# Patient Record
Sex: Female | Born: 1980 | Hispanic: No | Marital: Single | Smoking: Former smoker
Health system: Southern US, Community
[De-identification: ages and names within clinical notes are randomized; demographics above are authoritative.]

## PROBLEM LIST (undated history)

## (undated) DIAGNOSIS — F419 Anxiety disorder, unspecified: Secondary | ICD-10-CM

## (undated) DIAGNOSIS — N83209 Unspecified ovarian cyst, unspecified side: Secondary | ICD-10-CM

## (undated) DIAGNOSIS — F32A Depression, unspecified: Secondary | ICD-10-CM

## (undated) DIAGNOSIS — G43909 Migraine, unspecified, not intractable, without status migrainosus: Secondary | ICD-10-CM

## (undated) DIAGNOSIS — J45909 Unspecified asthma, uncomplicated: Secondary | ICD-10-CM

## (undated) DIAGNOSIS — F909 Attention-deficit hyperactivity disorder, unspecified type: Secondary | ICD-10-CM

## (undated) DIAGNOSIS — D649 Anemia, unspecified: Secondary | ICD-10-CM

## (undated) DIAGNOSIS — A749 Chlamydial infection, unspecified: Secondary | ICD-10-CM

## (undated) DIAGNOSIS — F329 Major depressive disorder, single episode, unspecified: Secondary | ICD-10-CM

## (undated) HISTORY — DX: Depression, unspecified: F32.A

## (undated) HISTORY — DX: Unspecified ovarian cyst, unspecified side: N83.209

## (undated) HISTORY — DX: Attention-deficit hyperactivity disorder, unspecified type: F90.9

## (undated) HISTORY — DX: Unspecified asthma, uncomplicated: J45.909

## (undated) HISTORY — PX: CHOLECYSTECTOMY: SHX55

## (undated) HISTORY — DX: Major depressive disorder, single episode, unspecified: F32.9

## (undated) HISTORY — PX: DILATION AND CURETTAGE OF UTERUS: SHX78

## (undated) HISTORY — DX: Anemia, unspecified: D64.9

## (undated) HISTORY — DX: Chlamydial infection, unspecified: A74.9

## (undated) HISTORY — DX: Migraine, unspecified, not intractable, without status migrainosus: G43.909

## (undated) HISTORY — DX: Anxiety disorder, unspecified: F41.9

---
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
## 2000-11-17 ENCOUNTER — Ambulatory Visit (HOSPITAL_COMMUNITY): Admission: RE | Admit: 2000-11-17 | Discharge: 2000-11-17 | Payer: Self-pay | Admitting: *Deleted

## 2001-01-08 ENCOUNTER — Inpatient Hospital Stay (HOSPITAL_COMMUNITY): Admission: AD | Admit: 2001-01-08 | Discharge: 2001-01-08 | Payer: Self-pay | Admitting: Obstetrics & Gynecology

## 2001-03-19 ENCOUNTER — Inpatient Hospital Stay (HOSPITAL_COMMUNITY): Admission: AD | Admit: 2001-03-19 | Discharge: 2001-03-21 | Payer: Self-pay | Admitting: *Deleted

## 2001-11-12 ENCOUNTER — Emergency Department (HOSPITAL_COMMUNITY): Admission: EM | Admit: 2001-11-12 | Discharge: 2001-11-12 | Payer: Self-pay | Admitting: Emergency Medicine

## 2002-01-28 ENCOUNTER — Emergency Department (HOSPITAL_COMMUNITY): Admission: EM | Admit: 2002-01-28 | Discharge: 2002-01-29 | Payer: Self-pay | Admitting: Emergency Medicine

## 2002-01-29 ENCOUNTER — Encounter: Payer: Self-pay | Admitting: Emergency Medicine

## 2002-02-19 ENCOUNTER — Encounter (INDEPENDENT_AMBULATORY_CARE_PROVIDER_SITE_OTHER): Payer: Self-pay | Admitting: Specialist

## 2002-02-19 ENCOUNTER — Observation Stay (HOSPITAL_COMMUNITY): Admission: RE | Admit: 2002-02-19 | Discharge: 2002-02-20 | Payer: Self-pay

## 2002-04-23 ENCOUNTER — Ambulatory Visit (HOSPITAL_COMMUNITY): Admission: RE | Admit: 2002-04-23 | Discharge: 2002-04-23 | Payer: Self-pay | Admitting: Pediatrics

## 2002-04-23 ENCOUNTER — Encounter: Payer: Self-pay | Admitting: Cardiology

## 2002-08-26 ENCOUNTER — Emergency Department (HOSPITAL_COMMUNITY): Admission: EM | Admit: 2002-08-26 | Discharge: 2002-08-26 | Payer: Self-pay

## 2002-09-19 ENCOUNTER — Encounter (INDEPENDENT_AMBULATORY_CARE_PROVIDER_SITE_OTHER): Payer: Self-pay

## 2002-09-19 ENCOUNTER — Observation Stay (HOSPITAL_COMMUNITY): Admission: AD | Admit: 2002-09-19 | Discharge: 2002-09-20 | Payer: Self-pay | Admitting: Family Medicine

## 2003-01-17 ENCOUNTER — Emergency Department (HOSPITAL_COMMUNITY): Admission: EM | Admit: 2003-01-17 | Discharge: 2003-01-17 | Payer: Self-pay | Admitting: Emergency Medicine

## 2003-03-22 ENCOUNTER — Emergency Department (HOSPITAL_COMMUNITY): Admission: EM | Admit: 2003-03-22 | Discharge: 2003-03-22 | Payer: Self-pay | Admitting: Emergency Medicine

## 2003-07-20 ENCOUNTER — Emergency Department (HOSPITAL_COMMUNITY): Admission: EM | Admit: 2003-07-20 | Discharge: 2003-07-20 | Payer: Self-pay | Admitting: Emergency Medicine

## 2004-01-22 ENCOUNTER — Emergency Department (HOSPITAL_COMMUNITY): Admission: AD | Admit: 2004-01-22 | Discharge: 2004-01-22 | Payer: Self-pay | Admitting: Family Medicine

## 2004-02-24 ENCOUNTER — Ambulatory Visit (HOSPITAL_COMMUNITY): Admission: RE | Admit: 2004-02-24 | Discharge: 2004-02-24 | Payer: Self-pay | Admitting: *Deleted

## 2004-03-08 ENCOUNTER — Encounter: Admission: RE | Admit: 2004-03-08 | Discharge: 2004-03-08 | Payer: Self-pay | Admitting: Obstetrics & Gynecology

## 2004-03-22 ENCOUNTER — Encounter: Admission: RE | Admit: 2004-03-22 | Discharge: 2004-03-22 | Payer: Self-pay | Admitting: *Deleted

## 2004-03-28 ENCOUNTER — Ambulatory Visit (HOSPITAL_COMMUNITY): Admission: RE | Admit: 2004-03-28 | Discharge: 2004-03-28 | Payer: Self-pay | Admitting: Family Medicine

## 2004-04-01 ENCOUNTER — Ambulatory Visit: Payer: Self-pay | Admitting: Family Medicine

## 2004-04-01 ENCOUNTER — Inpatient Hospital Stay (HOSPITAL_COMMUNITY): Admission: AD | Admit: 2004-04-01 | Discharge: 2004-04-03 | Payer: Self-pay | Admitting: Otolaryngology

## 2004-04-05 ENCOUNTER — Encounter: Admission: RE | Admit: 2004-04-05 | Discharge: 2004-04-05 | Payer: Self-pay | Admitting: *Deleted

## 2004-04-26 ENCOUNTER — Ambulatory Visit: Payer: Self-pay | Admitting: Family Medicine

## 2004-05-03 ENCOUNTER — Ambulatory Visit: Payer: Self-pay | Admitting: Family Medicine

## 2004-05-03 ENCOUNTER — Ambulatory Visit (HOSPITAL_COMMUNITY): Admission: RE | Admit: 2004-05-03 | Discharge: 2004-05-03 | Payer: Self-pay | Admitting: *Deleted

## 2004-05-09 ENCOUNTER — Inpatient Hospital Stay (HOSPITAL_COMMUNITY): Admission: AD | Admit: 2004-05-09 | Discharge: 2004-05-09 | Payer: Self-pay | Admitting: Obstetrics and Gynecology

## 2004-08-13 ENCOUNTER — Inpatient Hospital Stay (HOSPITAL_COMMUNITY): Admission: AD | Admit: 2004-08-13 | Discharge: 2004-08-13 | Payer: Self-pay | Admitting: Obstetrics and Gynecology

## 2004-09-19 ENCOUNTER — Ambulatory Visit: Payer: Self-pay | Admitting: *Deleted

## 2004-09-20 ENCOUNTER — Ambulatory Visit (HOSPITAL_COMMUNITY): Admission: RE | Admit: 2004-09-20 | Discharge: 2004-09-20 | Payer: Self-pay | Admitting: Obstetrics & Gynecology

## 2004-09-20 ENCOUNTER — Inpatient Hospital Stay (HOSPITAL_COMMUNITY): Admission: AD | Admit: 2004-09-20 | Discharge: 2004-09-20 | Payer: Self-pay | Admitting: Obstetrics & Gynecology

## 2004-09-22 ENCOUNTER — Inpatient Hospital Stay (HOSPITAL_COMMUNITY): Admission: AD | Admit: 2004-09-22 | Discharge: 2004-09-22 | Payer: Self-pay | Admitting: Family Medicine

## 2004-09-22 ENCOUNTER — Ambulatory Visit: Payer: Self-pay | Admitting: Family Medicine

## 2004-09-25 ENCOUNTER — Inpatient Hospital Stay (HOSPITAL_COMMUNITY): Admission: AD | Admit: 2004-09-25 | Discharge: 2004-09-27 | Payer: Self-pay | Admitting: *Deleted

## 2004-09-25 ENCOUNTER — Ambulatory Visit: Payer: Self-pay | Admitting: *Deleted

## 2005-10-06 ENCOUNTER — Emergency Department (HOSPITAL_COMMUNITY): Admission: EM | Admit: 2005-10-06 | Discharge: 2005-10-06 | Payer: Self-pay | Admitting: Family Medicine

## 2005-11-29 ENCOUNTER — Emergency Department (HOSPITAL_COMMUNITY): Admission: EM | Admit: 2005-11-29 | Discharge: 2005-11-30 | Payer: Self-pay | Admitting: Emergency Medicine

## 2005-12-12 ENCOUNTER — Emergency Department (HOSPITAL_COMMUNITY): Admission: EM | Admit: 2005-12-12 | Discharge: 2005-12-12 | Payer: Self-pay | Admitting: Emergency Medicine

## 2007-08-06 ENCOUNTER — Inpatient Hospital Stay (HOSPITAL_COMMUNITY): Admission: AD | Admit: 2007-08-06 | Discharge: 2007-08-06 | Payer: Self-pay | Admitting: Obstetrics & Gynecology

## 2007-08-21 ENCOUNTER — Ambulatory Visit: Payer: Self-pay | Admitting: Gynecology

## 2007-08-21 ENCOUNTER — Ambulatory Visit (HOSPITAL_COMMUNITY): Admission: AD | Admit: 2007-08-21 | Discharge: 2007-08-21 | Payer: Self-pay | Admitting: Obstetrics and Gynecology

## 2007-08-21 ENCOUNTER — Encounter (INDEPENDENT_AMBULATORY_CARE_PROVIDER_SITE_OTHER): Payer: Self-pay | Admitting: Gynecology

## 2007-11-16 ENCOUNTER — Emergency Department (HOSPITAL_COMMUNITY): Admission: EM | Admit: 2007-11-16 | Discharge: 2007-11-16 | Payer: Self-pay | Admitting: Emergency Medicine

## 2007-12-13 ENCOUNTER — Emergency Department (HOSPITAL_COMMUNITY): Admission: EM | Admit: 2007-12-13 | Discharge: 2007-12-13 | Payer: Self-pay | Admitting: Emergency Medicine

## 2008-01-15 ENCOUNTER — Emergency Department (HOSPITAL_COMMUNITY): Admission: EM | Admit: 2008-01-15 | Discharge: 2008-01-15 | Payer: Self-pay | Admitting: Emergency Medicine

## 2008-06-10 ENCOUNTER — Emergency Department (HOSPITAL_COMMUNITY): Admission: EM | Admit: 2008-06-10 | Discharge: 2008-06-10 | Payer: Self-pay | Admitting: Emergency Medicine

## 2008-10-17 ENCOUNTER — Inpatient Hospital Stay (HOSPITAL_COMMUNITY): Admission: AD | Admit: 2008-10-17 | Discharge: 2008-10-17 | Payer: Self-pay | Admitting: Obstetrics & Gynecology

## 2008-10-17 ENCOUNTER — Ambulatory Visit: Payer: Self-pay | Admitting: Obstetrics and Gynecology

## 2008-10-25 ENCOUNTER — Ambulatory Visit (HOSPITAL_COMMUNITY): Admission: RE | Admit: 2008-10-25 | Discharge: 2008-10-25 | Payer: Self-pay | Admitting: Family Medicine

## 2008-10-26 ENCOUNTER — Encounter: Payer: Self-pay | Admitting: Obstetrics and Gynecology

## 2008-10-26 ENCOUNTER — Ambulatory Visit: Payer: Self-pay | Admitting: Obstetrics & Gynecology

## 2008-10-26 ENCOUNTER — Encounter: Payer: Self-pay | Admitting: Obstetrics & Gynecology

## 2008-10-26 LAB — CONVERTED CEMR LAB
Antibody Screen: NEGATIVE
Basophils Absolute: 0 10*3/uL (ref 0.0–0.1)
Hemoglobin: 11 g/dL — ABNORMAL LOW (ref 12.0–15.0)
Hgb A2 Quant: 2.8 % (ref 2.2–3.2)
Hgb A: 97.2 % (ref 96.8–97.8)
Hgb F Quant: 0 % (ref 0.0–2.0)
MCHC: 34.1 g/dL (ref 30.0–36.0)
MCV: 91.5 fL (ref 78.0–100.0)
Monocytes Absolute: 0.6 10*3/uL (ref 0.1–1.0)
Neutro Abs: 5.8 10*3/uL (ref 1.7–7.7)
Platelets: 237 10*3/uL (ref 150–400)
Rubella: 15.6 intl units/mL — ABNORMAL HIGH

## 2008-10-27 ENCOUNTER — Encounter: Payer: Self-pay | Admitting: Obstetrics & Gynecology

## 2008-10-27 ENCOUNTER — Ambulatory Visit: Payer: Self-pay | Admitting: Family Medicine

## 2008-10-31 ENCOUNTER — Ambulatory Visit: Payer: Self-pay | Admitting: Obstetrics & Gynecology

## 2008-11-01 ENCOUNTER — Ambulatory Visit (HOSPITAL_COMMUNITY): Admission: RE | Admit: 2008-11-01 | Discharge: 2008-11-01 | Payer: Self-pay | Admitting: Obstetrics & Gynecology

## 2008-11-03 ENCOUNTER — Ambulatory Visit: Payer: Self-pay | Admitting: Obstetrics & Gynecology

## 2008-11-10 ENCOUNTER — Ambulatory Visit (HOSPITAL_COMMUNITY): Admission: RE | Admit: 2008-11-10 | Discharge: 2008-11-10 | Payer: Self-pay | Admitting: Obstetrics & Gynecology

## 2008-11-10 ENCOUNTER — Encounter: Payer: Self-pay | Admitting: Obstetrics & Gynecology

## 2008-11-10 ENCOUNTER — Ambulatory Visit: Payer: Self-pay | Admitting: Obstetrics & Gynecology

## 2008-11-17 ENCOUNTER — Ambulatory Visit: Payer: Self-pay | Admitting: Obstetrics & Gynecology

## 2008-11-24 ENCOUNTER — Ambulatory Visit: Payer: Self-pay | Admitting: Family Medicine

## 2008-11-30 ENCOUNTER — Ambulatory Visit: Payer: Self-pay | Admitting: Obstetrics & Gynecology

## 2008-12-01 ENCOUNTER — Ambulatory Visit (HOSPITAL_COMMUNITY): Admission: RE | Admit: 2008-12-01 | Discharge: 2008-12-01 | Payer: Self-pay | Admitting: Obstetrics & Gynecology

## 2008-12-08 ENCOUNTER — Ambulatory Visit: Payer: Self-pay | Admitting: Obstetrics & Gynecology

## 2008-12-08 ENCOUNTER — Encounter: Payer: Self-pay | Admitting: Obstetrics & Gynecology

## 2008-12-15 ENCOUNTER — Ambulatory Visit: Payer: Self-pay | Admitting: Obstetrics & Gynecology

## 2008-12-15 LAB — CONVERTED CEMR LAB
Hemoglobin: 10.8 g/dL — ABNORMAL LOW (ref 12.0–15.0)
MCV: 88.3 fL (ref 78.0–100.0)
RBC: 3.6 M/uL — ABNORMAL LOW (ref 3.87–5.11)
WBC: 8.5 10*3/uL (ref 4.0–10.5)

## 2008-12-22 ENCOUNTER — Ambulatory Visit (HOSPITAL_COMMUNITY): Admission: RE | Admit: 2008-12-22 | Discharge: 2008-12-22 | Payer: Self-pay | Admitting: Obstetrics & Gynecology

## 2008-12-22 ENCOUNTER — Ambulatory Visit: Payer: Self-pay | Admitting: Obstetrics & Gynecology

## 2008-12-29 ENCOUNTER — Ambulatory Visit: Payer: Self-pay | Admitting: Family Medicine

## 2008-12-30 ENCOUNTER — Encounter: Payer: Self-pay | Admitting: Obstetrics & Gynecology

## 2008-12-30 LAB — CONVERTED CEMR LAB: Trich, Wet Prep: NONE SEEN

## 2009-01-05 ENCOUNTER — Ambulatory Visit: Payer: Self-pay | Admitting: Obstetrics & Gynecology

## 2009-01-05 ENCOUNTER — Encounter: Payer: Self-pay | Admitting: Obstetrics & Gynecology

## 2009-01-20 ENCOUNTER — Ambulatory Visit: Payer: Self-pay | Admitting: Family Medicine

## 2009-01-26 ENCOUNTER — Ambulatory Visit: Payer: Self-pay | Admitting: Obstetrics & Gynecology

## 2009-01-26 ENCOUNTER — Ambulatory Visit (HOSPITAL_COMMUNITY): Admission: RE | Admit: 2009-01-26 | Discharge: 2009-01-26 | Payer: Self-pay | Admitting: Obstetrics & Gynecology

## 2009-01-27 ENCOUNTER — Encounter: Payer: Self-pay | Admitting: Obstetrics & Gynecology

## 2009-02-02 ENCOUNTER — Ambulatory Visit: Payer: Self-pay | Admitting: Obstetrics & Gynecology

## 2009-02-09 ENCOUNTER — Encounter: Payer: Self-pay | Admitting: Obstetrics & Gynecology

## 2009-02-09 ENCOUNTER — Ambulatory Visit: Payer: Self-pay | Admitting: Obstetrics & Gynecology

## 2009-02-10 ENCOUNTER — Encounter: Payer: Self-pay | Admitting: Obstetrics & Gynecology

## 2009-02-10 LAB — CONVERTED CEMR LAB
Chlamydia, DNA Probe: NEGATIVE
GC Probe Amp, Genital: NEGATIVE

## 2009-02-16 ENCOUNTER — Ambulatory Visit: Payer: Self-pay | Admitting: Obstetrics & Gynecology

## 2009-02-20 ENCOUNTER — Ambulatory Visit: Payer: Self-pay | Admitting: Physician Assistant

## 2009-02-20 ENCOUNTER — Inpatient Hospital Stay (HOSPITAL_COMMUNITY): Admission: AD | Admit: 2009-02-20 | Discharge: 2009-02-20 | Payer: Self-pay | Admitting: Obstetrics & Gynecology

## 2009-02-23 ENCOUNTER — Ambulatory Visit: Payer: Self-pay | Admitting: Obstetrics & Gynecology

## 2009-02-26 ENCOUNTER — Inpatient Hospital Stay (HOSPITAL_COMMUNITY): Admission: AD | Admit: 2009-02-26 | Discharge: 2009-02-26 | Payer: Self-pay | Admitting: Obstetrics & Gynecology

## 2009-03-02 ENCOUNTER — Ambulatory Visit: Payer: Self-pay | Admitting: Obstetrics & Gynecology

## 2009-03-04 ENCOUNTER — Inpatient Hospital Stay (HOSPITAL_COMMUNITY): Admission: AD | Admit: 2009-03-04 | Discharge: 2009-03-07 | Payer: Self-pay | Admitting: Obstetrics & Gynecology

## 2009-03-04 ENCOUNTER — Ambulatory Visit: Payer: Self-pay | Admitting: Physician Assistant

## 2009-04-19 ENCOUNTER — Ambulatory Visit: Payer: Self-pay | Admitting: Obstetrics & Gynecology

## 2009-09-16 ENCOUNTER — Emergency Department (HOSPITAL_COMMUNITY): Admission: EM | Admit: 2009-09-16 | Discharge: 2009-09-16 | Payer: Self-pay | Admitting: Family Medicine

## 2010-07-06 ENCOUNTER — Emergency Department (HOSPITAL_COMMUNITY): Admission: EM | Admit: 2010-07-06 | Discharge: 2010-07-06 | Payer: Self-pay | Admitting: Emergency Medicine

## 2010-12-02 LAB — POCT URINALYSIS DIP (DEVICE)
Bilirubin Urine: NEGATIVE
Bilirubin Urine: NEGATIVE
Hgb urine dipstick: NEGATIVE
Nitrite: NEGATIVE
Specific Gravity, Urine: 1.02 (ref 1.005–1.030)
Urobilinogen, UA: 4 mg/dL — ABNORMAL HIGH (ref 0.0–1.0)

## 2010-12-02 LAB — CBC
HCT: 31.5 % — ABNORMAL LOW (ref 36.0–46.0)
MCV: 81.6 fL (ref 78.0–100.0)
Platelets: 229 10*3/uL (ref 150–400)
WBC: 10.3 10*3/uL (ref 4.0–10.5)

## 2010-12-02 LAB — WET PREP, GENITAL: Trich, Wet Prep: NONE SEEN

## 2010-12-03 LAB — POCT URINALYSIS DIP (DEVICE)
Bilirubin Urine: NEGATIVE
Bilirubin Urine: NEGATIVE
Bilirubin Urine: NEGATIVE
Bilirubin Urine: NEGATIVE
Glucose, UA: NEGATIVE mg/dL
Glucose, UA: NEGATIVE mg/dL
Glucose, UA: NEGATIVE mg/dL
Hgb urine dipstick: NEGATIVE
Hgb urine dipstick: NEGATIVE
Ketones, ur: NEGATIVE mg/dL
Ketones, ur: NEGATIVE mg/dL
Nitrite: NEGATIVE
Nitrite: NEGATIVE
Protein, ur: 100 mg/dL — AB
Protein, ur: 100 mg/dL — AB
Protein, ur: 100 mg/dL — AB
Specific Gravity, Urine: 1.02 (ref 1.005–1.030)
Specific Gravity, Urine: 1.025 (ref 1.005–1.030)
Urobilinogen, UA: 1 mg/dL (ref 0.0–1.0)
Urobilinogen, UA: 4 mg/dL — ABNORMAL HIGH (ref 0.0–1.0)
pH: 7 (ref 5.0–8.0)
pH: 7 (ref 5.0–8.0)

## 2010-12-04 LAB — POCT URINALYSIS DIP (DEVICE)
Bilirubin Urine: NEGATIVE
Bilirubin Urine: NEGATIVE
Glucose, UA: NEGATIVE mg/dL
Glucose, UA: NEGATIVE mg/dL
Ketones, ur: NEGATIVE mg/dL
Nitrite: NEGATIVE
Protein, ur: 30 mg/dL — AB
Specific Gravity, Urine: 1.02 (ref 1.005–1.030)
Specific Gravity, Urine: 1.02 (ref 1.005–1.030)
Urobilinogen, UA: 0.2 mg/dL (ref 0.0–1.0)
Urobilinogen, UA: 0.2 mg/dL (ref 0.0–1.0)

## 2010-12-05 LAB — POCT URINALYSIS DIP (DEVICE)
Bilirubin Urine: NEGATIVE
Bilirubin Urine: NEGATIVE
Glucose, UA: NEGATIVE mg/dL
Glucose, UA: NEGATIVE mg/dL
Glucose, UA: NEGATIVE mg/dL
Glucose, UA: NEGATIVE mg/dL
Hgb urine dipstick: NEGATIVE
Ketones, ur: NEGATIVE mg/dL
Nitrite: NEGATIVE
Nitrite: NEGATIVE
Nitrite: NEGATIVE
Protein, ur: 30 mg/dL — AB
Specific Gravity, Urine: 1.02 (ref 1.005–1.030)
Urobilinogen, UA: 1 mg/dL (ref 0.0–1.0)
Urobilinogen, UA: 1 mg/dL (ref 0.0–1.0)
pH: 6 (ref 5.0–8.0)

## 2010-12-06 LAB — POCT URINALYSIS DIP (DEVICE)
Bilirubin Urine: NEGATIVE
Bilirubin Urine: NEGATIVE
Bilirubin Urine: NEGATIVE
Bilirubin Urine: NEGATIVE
Bilirubin Urine: NEGATIVE
Glucose, UA: NEGATIVE mg/dL
Glucose, UA: NEGATIVE mg/dL
Glucose, UA: NEGATIVE mg/dL
Glucose, UA: NEGATIVE mg/dL
Glucose, UA: NEGATIVE mg/dL
Hgb urine dipstick: NEGATIVE
Hgb urine dipstick: NEGATIVE
Hgb urine dipstick: NEGATIVE
Hgb urine dipstick: NEGATIVE
Ketones, ur: NEGATIVE mg/dL
Ketones, ur: NEGATIVE mg/dL
Ketones, ur: NEGATIVE mg/dL
Ketones, ur: NEGATIVE mg/dL
Nitrite: NEGATIVE
Protein, ur: 100 mg/dL — AB
Specific Gravity, Urine: 1.025 (ref 1.005–1.030)
Specific Gravity, Urine: >=1.03 (ref 1.005–1.030)
Specific Gravity, Urine: 1.015 (ref 1.005–1.030)
Specific Gravity, Urine: 1.02 (ref 1.005–1.030)
Specific Gravity, Urine: 1.025 (ref 1.005–1.030)
Urobilinogen, UA: 1 mg/dL (ref 0.0–1.0)
pH: 6 (ref 5.0–8.0)
pH: 6 (ref 5.0–8.0)
pH: 6.5 (ref 5.0–8.0)

## 2010-12-11 LAB — URINALYSIS, ROUTINE W REFLEX MICROSCOPIC
Glucose, UA: NEGATIVE mg/dL
Hgb urine dipstick: NEGATIVE
Specific Gravity, Urine: 1.01 (ref 1.005–1.030)
pH: 6.5 (ref 5.0–8.0)

## 2010-12-11 LAB — WET PREP, GENITAL
Clue Cells Wet Prep HPF POC: NONE SEEN
Trich, Wet Prep: NONE SEEN

## 2011-01-08 NOTE — Op Note (Signed)
NAMEGRAYCEN, Kathryn Norman             ACCOUNT NO.:  [id number]   MEDICAL RECORD NO.:  [id number]          PATIENT TYPE:  AMB   LOCATION:  [facility]                          [facility]ILITY:  [facility]   PHYSICIAN:  Ginger Carne, MD  DATE OF BIRTH:  Dec 30, 1980   DATE OF PROCEDURE:  DATE OF DISCHARGE:                               OPERATIVE REPORT   PREOPERATIVE DIAGNOSIS:  A 14-week incomplete abortion.   POSTOPERATIVE DIAGNOSIS:  A 14-week incomplete abortion.   PROCEDURE:  Dilation and extraction.   SURGEON:  Ginger Carne, MD   ASSISTANT:  None.   COMPLICATIONS:  None.   ESTIMATED BLOOD LOSS:  Minimal.   SPECIMEN:  Products of conception to Pathology.   OPERATIVE FINDINGS:  The patient is rh positive, 14 weeks size.  Pregnancy with intrauterine products of conception.  Both adnexa were  palpable, found to be normal.  External genitalia, vulva and vagina were  normal.  Cervix moved without erosions or lesions.   OPERATIVE PROCEDURE:  The patient was prepped and draped in the usual  fashion and placed in a lithotomy position.  Betadine soaks were used  for antiseptic.  The patient was catheterized prior to the procedure.  After adequate general anesthesia, tenaculum was placed on the anterior  lip of the cervix.  Minimal dilatation was required.  A #12 suction  cannula was utilized.  Suction and sharp curettage followed.  Specimen  to Pathology.  The uterine cavity was cleaned at the end of the  procedure with scant flow.  The patient tolerated the procedure well and  was returned to the postanesthesia recovery room in excellent condition.      Ginger Carne, MD  Electronically Signed     SHB/MEDQ  D:  08/21/2007  T:  08/21/2007  Job:  [id number]

## 2011-01-08 NOTE — Group Therapy Note (Signed)
NAME:  Kathryn Norman, Kathryn Norman             ACCOUNT NO.:  [id number]   MEDICAL RECORD NO.:  [id number]          PATIENT TYPE:  WOC   LOCATION:  [facility]                   FACILITY:  [facility]   PHYSICIAN:  Elsie Lincoln, MD      DATE OF BIRTH:  May 17, 1981   DATE OF SERVICE:                                  CLINIC NOTE   This is a 30 year old, gravida 5, para 2-1-2-3, who is 6 weeks  postpartum, who presents for IUD insertion.  She had a spontaneous  vaginal delivery on March 05, 2009 with no complication.  She had no  episiotomy.  She bled for 2 weeks postpartum and then has had no further  bleeding.  She is requesting IUD for contraception.  She has had no  bridge.  Pregnancy test was done and was negative.  Consent was obtained  and signed.  History is remarkable for a late prenatal care and preterm  labor procedure performed by Dr. Marice Potter.  Bimanual exam was done by Wynelle Bourgeois, certified nurse midwife.  Uterus is small and involuted mid plane.  Adnexa nontender with no  masses.  Cervix is multiparous with no abnormalities noted.  Speculum  was inserted.  Cervix was cleansed with Betadine.  Swab tenaculum was  applied to the anterior cervix.  Uterus was sounded to 5-6 cm.  Mirena  IUD was inserted per normal procedure atraumatically.  Strings were  clipped and the patient tolerated the procedure well.  The patient will  return to [facility] for her annual exam and Pap smear in March.     ______________________________  Wynelle Bourgeois, CNM    ______________________________  Elsie Lincoln, MD    MW/MEDQ  D:  04/19/2009  T:  04/20/2009  Job:  [id number]

## 2011-01-11 NOTE — Op Note (Signed)
   NAMEJERRY, Kathryn Norman                         ACCOUNT NO.:  [id number]   MEDICAL RECORD NO.:  [id number]                   PATIENT TYPE:  INP   LOCATION:  9178                                 [facility]   PHYSICIAN:  Tanya S. Shawnie Pons, M.D.                DATE OF BIRTH:  Jun 13, 1981   DATE OF PROCEDURE:  09/19/2002  DATE OF DISCHARGE:                                 OPERATIVE REPORT   PREOPERATIVE DIAGNOSIS:  Retained placenta.   POSTOPERATIVE DIAGNOSIS:  Retained placenta.   PROCEDURE:  Dilatation and curettage.   SURGEON:  Shelbie Proctor. Shawnie Pons, M.D.   ANESTHESIA:  General endotracheal tube with Dr. Arby Barrette and local.   ESTIMATED BLOOD LOSS:  200 mL.   COMPLICATIONS:  None.   SPECIMENS:  Placenta to pathology.   INDICATIONS:  The patient is a 30 year old G2, P1 who is status post  spontaneous delivery of a nonviable female infant at 30 weeks with retained  placenta and active bleeding.  Initially, the patient refused all blood  products so the decision was made to take her to the OR sooner rather than  later for delivery.   DESCRIPTION OF PROCEDURE:  The patient was taken to the OR where she was  placed in the dorsal lithotomy, and MAC anesthesia was attempted.  The  patient was obviously feeling very uncomfortable so she was put to sleep via  general anesthesia.  When it was found to be adequate, she was prepped and  draped in the usual sterile fashion.  A red rubber catheter was inserted  into the bladder, and no urine was obtained.  A speculum was then placed  inside the vagina.  The cervix was visualized and grasped with a ring  forceps.  A large horseshoe curet was then passed through the uterus and  sharp curettings were undergone until the placenta was at the cervical os at  which time it was grasped with a ring forceps and removed.  Sharp curettage  was performed until there was a gritty surface in all quadrants of the  uterus at which time the ring forceps and  speculum were removed.  Bimanual  massage was started, and Pitocin was then administered IV.  The uterus was  firm and clamped down.  There was no active bleeding at the end of the  procedure.  All instrument, needle and lap counts were correct x2.  The  patient was awakened and taken to the recovery room in stable condition.                                               Shelbie Proctor. Shawnie Pons, M.D.    TSP/MEDQ  D:  09/19/2002  T:  09/19/2002  Job:  [id number]

## 2011-01-11 NOTE — Op Note (Signed)
NAME:  Kathryn Norman, Kathryn Norman             ACCOUNT NO.:  [id number]   MEDICAL RECORD NO.:  [id number]          PATIENT TYPE:  AMB   LOCATION:  [facility]                           [facility]   PHYSICIAN:  Lesly Dukes, M.D. DATE OF BIRTH:  Jul 20, 1981   DATE OF PROCEDURE:  09/20/2004  DATE OF DISCHARGE:                                 OPERATIVE REPORT   PREOPERATIVE DIAGNOSIS:  A 30 year old female with embedded cervical  cerclage at approximately 9 weeks' estimated gestational age.   POSTOPERATIVE DIAGNOSIS:  A 30 year old female with embedded cervical  cerclage at approximately 79 weeks' estimated gestational age.   PROCEDURE:  Removal of cervical cerclage.   SURGEON:  Lesly Dukes, M.D.   ASSISTANT:  None.   ANESTHESIA:  MAC.   SPECIMENS:  None.   ESTIMATED BLOOD LOSS:  Minimal.   COMPLICATIONS:  None.   FINDINGS:  Mersilene McDonald cerclage with the knot tab anteriorly.  The  baby is vertex and 3 cm dilated and 50% effaced at the end of surgery.   PROCEDURE:  After informed consent was obtained, patient taken to the  operating room and MAC anesthesia was administered.  The patient was in the  dorsal lithotomy position and prepared and draped in the normal sterile  fashion.  A weighted speculum was placed into the vagina and the cervix was  brought into view.  The cervix was grasped with a sponge stick and gently  placed on tension.  The cerclage was then grasped with a second sponge stick  and the knot was visualized.  A scissors was used to cut the stitch below  the knot and the stitch was easily removed.  Hemostasis was achieved with  direct pressure.  The patient tolerated the procedure well.  The sponge, lap  and instrument and needle count correct x2, and the patient went to the  recovery room in stable condition.  Before she goes home, she will have a 20-  minute NST.  She is also anemic with hemoglobin 8.8 and will be sent home  with iron sulfate.     KHL/MEDQ  D:  09/20/2004  T:  09/20/2004  Job:  [id number]

## 2011-01-11 NOTE — Op Note (Signed)
[facility]  Patient:    Kathryn Norman, Kathryn Norman Visit Number: [id number] MRN: [id number]          Service Type: OBV Location: 2S 4782 01 Attending Physician:  Meredith Leeds Dictated by:   Zigmund Daniel, M.D. Proc. Date: 02/19/02 Admit Date:  02/19/2002 Discharge Date: 02/20/2002                             Operative Report  PREOPERATIVE DIAGNOSIS:  Cholelithiasis and chronic cholecystitis.  POSTOPERATIVE DIAGNOSIS:  Cholelithiasis and chronic cholecystitis.  OPERATION PERFORMED:  Laparoscopic cholecystectomy.  SURGEON:  Zigmund Daniel, M.D.  ASSISTANT:  Anselm Pancoast. Zachery Dakins, M.D.  ANESTHESIA:  General.  DESCRIPTION OF PROCEDURE:  After adequate anesthesia and routine preparation of the abdomen, I anesthetized the area below the umbilicus and made a short transverse incision, cut the fascia longitudinally and entered the peritoneum bluntly.  I secured a Hasson cannula with a 0 Vicryl pursestring suture in the fascia and inflated the abdomen with CO2.  No abnormalities were noted except for a slightly distended gallbladder with adhesions to the undersurface of the gallbladder and liver.  I anesthetized three additional port sites and put in two small  right midabdominal ports and a 10 mm epigastric port and took down the adhesions using the scissors and cautery.  I then retracted the fundus of the gallbladder toward the right shoulder and retracted the infundibulum laterally and dissected into the hepatoduodenal ligament until I clearly identified the cystic duct emerging from the gallbladder.  I clipped it with four clips and cut between the two closest to the gallbladder.  I then dissected out the cystic artery, clipped it with three clips and but between the two closest to the gallbladder.  I found one additional small cystic artery branch, clipped and divided it as well.  I then dissected the gallbladder from the liver using the the  hook cautery.  I accidentally made a couple of small holes in it and so after detaching it from the liver, I placed it in a plastic pouch.  I then copiously irrigated the right upper quadrant and removed the irrigant.  Hemostasis was excellent.  I removed the gallbladder from the body through the umbilical incision and tied the pursestring suture.  I then removed the lateral ports under direct vision and removed the epigastric port after allowing the CO2 to escape.  I closed all skin incisions  with intracuticular 4-0 Vicryl and Steri-Strips.  The patient tolerated the operation well. Dictated by:   Zigmund Daniel, M.D. Attending Physician:  Meredith Leeds DD:  02/19/02 TD:  02/22/02 Job: [id number]

## 2011-01-11 NOTE — Discharge Summary (Signed)
[facility]  Patient:    Kathryn Norman, Kathryn Norman                      MRN: [id number] Adm. Date:  [id number] Disc. Date: [id number] Attending:  Antionette Char Dictator:   Georgana Curio, M.D.                           Discharge Summary  DATE OF BIRTH:                [birth year].  DICTATION CANCELLED PER DICTATOR. DD:  03/30/01 TD:  03/31/01 Job: [id number]

## 2011-01-11 NOTE — Discharge Summary (Signed)
   Kathryn Norman, Kathryn Norman                         ACCOUNT NO.:  [id number]   MEDICAL RECORD NO.:  [id number]                   PATIENT TYPE:  INP   LOCATION:  9325                                 [facility]   PHYSICIAN:  Rodolph Bong, M.D.                  DATE OF BIRTH:  1981-08-23   DATE OF ADMISSION:  DATE OF DISCHARGE:  09/20/2002                                 DISCHARGE SUMMARY   DISCHARGE DIAGNOSES:  1. Spontaneous abortion  at 17 weeks with cord prolapse and premature     rupture of membranes.  2. Dilatation and curettage secondary to retained placenta.   MEDICATIONS:  Ibuprofen 600 mg p.o. q.6h. p.r.n.   HISTORY OF PRESENT ILLNESS:  This is a 30 year old G2, P82 female who  presented at 17 weeks for the onset of cramping at noon and lower abdominal  pain and pressure. She arrived via EMS and complained of leaking for one  month. In the MAU she was found to have prolonged previable premature  preterm rupture of membranes with cord prolapse and fetal demise. She was  subsequently admitted to the ICU and given Cytotec 10 this delivery.   HOSPITAL COURSE:  After the patient was admitted she was begun on Cytotec.  At 1720 she delivered a nonviable female infant spontaneously. Afterwards she  was noted to have significant vaginal bleeding in the placenta to not be  removed secondary to the patient's discomfort. 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 There were no apparent  complications with the dilatation and curettage and she was sent to the  recovery room in stable condition.   The next morning she was noted to be doing well with minimal bleeding. She  was afebrile with only some mild abdominal discomfort. At this point it was  decided that she could be discharged home.   DISCHARGE INSTRUCTIONS:  Discharge activity, the patient was advised to have  pelvic  rest for 2 weeks. Diet no restrictions. Special instructions, the  patient was  advised to return to [facility] for worsening abdominal  pain, fever, significant bleeding or for any other concerns.   FOLLOW UP:  Gynecology clinic, the patient was  advised to call  [phone] to  schedule an appointment within the next 2 weeks.                                                 Rodolph Bong, M.D.    AK/MEDQ  D:  09/20/2002  T:  09/20/2002  Job:  [id number]

## 2011-01-11 NOTE — Op Note (Signed)
Kathryn Norman, Kathryn Norman                         ACCOUNT NO.:  [id number]   MEDICAL RECORD NO.:  [id number]                   PATIENT TYPE:  AMB   LOCATION:  SDC                                  [facility]   PHYSICIAN:  Tanya S. Shawnie Pons, M.D.                DATE OF BIRTH:  08-09-1981   DATE OF PROCEDURE:  03/28/2004  DATE OF DISCHARGE:                                 OPERATIVE REPORT   PREOPERATIVE DIAGNOSIS:  Intrauterine pregnancy at 13 weeks and incompetent  cervix.   POSTOPERATIVE DIAGNOSIS:  Intrauterine pregnancy at 13 weeks and incompetent  cervix.   OPERATION PERFORMED:  Cervical cerclage.   SURGEON:  Shelbie Proctor. Shawnie Pons, M.D.   ANESTHESIA:  Spinal, __________.   FINDINGS:  Soft 50% effaced cervix that was fingertip at the external os but  closed internally.   ESTIMATED BLOOD LOSS:  25 mL.   COMPLICATIONS:  None.   SPECIMENS:  None.   INDICATIONS FOR PROCEDURE:  The patient is a 30 year old gravida 3, para 1-0-  1-1, who has an OB history significant for a 39 week delivery in her first  pregnancy but having premature dilation being on bedrest for her entire  pregnancy.  Her second pregnancy resulted in a 16 week PPROM with loss.   DESCRIPTION OF PROCEDURE:  The patient was counseled of the risks and  benefits of the procedure.  She understood these risks and agreed to proceed  prior to going to the operating room.  Once she was in the operating room,  spinal analgesia was administered. The patient was placed in dorsal  lithotomy position in [place] stirrups.  She was prepped with Hibiclens and  then clindamycin douche was used.  A weighted speculum was placed inside the  vagina and a Deaver placed anteriorly.  The cervix was grasped with ring  forceps and a Mersilene band was used to place a McDonald cerclage. The  needle was placed at 12 o'clock and out at 9, in at 9 out at 6, in at 6 and  out at 3, in at 3 and out at 12.  This was tied down leaving closed cervix.  It  was approximately 2 cm above the cervical opening.  The patient tolerated  the procedure well.  All instrument, needle and lap counts were correct at  the end of the procedure.  The patient was awakened and taken to the  recovery room in stable condition.                                               Shelbie Proctor. Shawnie Pons, M.D.    TSP/MEDQ  D:  03/28/2004  T:  03/28/2004  Job:  [id number]

## 2011-01-11 NOTE — Discharge Summary (Signed)
NAMETERRELL, SHIMKO                         ACCOUNT NO.:  [id number]   MEDICAL RECORD NO.:  [id number]                   PATIENT TYPE:  INP   LOCATION:  9303                                 [facility]   PHYSICIAN:  Tanya S. Shawnie Pons, M.D.                DATE OF BIRTH:  1981/06/10   DATE OF ADMISSION:  04/01/2004  DATE OF DISCHARGE:                                 DISCHARGE SUMMARY   ADMISSION DIAGNOSIS:  Intrauterine pregnancy at 13 weeks, chorioamnionitis  versus urinary tract infection, bacterial vaginosis.   DISCHARGE DIAGNOSES:  1. Urinary tract infection.  2. Trichomoniasis.   DISCHARGE MEDICATIONS:  1. Bactrim DS to take one tablet q.12h.  2. Flagyl 500 mg to take one tablet q.12h. for 5 days.  3. Tylenol one or two tablets as needed for headache.   ADMISSION HISTORY:  This is a 30 year old African-American female that  presented at 13 weeks of pregnancy, G3 P1-0-1-1, complaining of back pain,  cramp, abdominal pain, and fever.  The patient had cerclage placed on March 28, 2004.  On physical examination, positive findings:  Tenderness to  palpation on lower abdomen, no costovertebral angle tenderness.  Cervix  examination:  Cerclage in place, cervix closed.  Uterus tender.   LABORATORY DATA:  Urine:  Positive leukocyte esterase, positive  trichomonads, 3-6 white blood cells, and few bacteria.  Wet prep was  trichomoniasis.   HOSPITAL COURSE:  The patient was admitted with a diagnosis of UTI and  trichomoniasis and was admitted with IV fluid treatment, antibiotic  treatment Rocephin 1 g IV q.12h. and Flagyl 500 mg IV q.6h.  During hospital  course the patient was afebrile without any abdominal pain, just a recent  frontal headache that got relieved with Tylenol two tablets.   CONDITION AT DISCHARGE:  The patient is stable on the antibiotic treatment.   The patient was instructed to take antibiotics p.o. Bactrim DS one tablet  q.12h. for 10 days and Flagyl 500 mg  one tablet q.12h. for 5 days.     Adrian Blackwater, MD                  Shelbie Proctor. Shawnie Pons, M.D.    IM/MEDQ  D:  04/03/2004  T:  04/03/2004  Job:  [id number]

## 2011-05-28 LAB — POCT URINALYSIS DIP (DEVICE)
Bilirubin Urine: NEGATIVE
pH: 6

## 2011-05-28 LAB — POCT PREGNANCY, URINE: Preg Test, Ur: NEGATIVE

## 2011-05-31 LAB — CBC
MCV: 86.8
RBC: 3.86 — ABNORMAL LOW
WBC: 8.8

## 2011-05-31 LAB — SAMPLE TO BLOOD BANK

## 2011-06-03 LAB — URINE MICROSCOPIC-ADD ON

## 2011-06-03 LAB — URINALYSIS, ROUTINE W REFLEX MICROSCOPIC
Glucose, UA: NEGATIVE
Ketones, ur: NEGATIVE
Specific Gravity, Urine: 1.015
pH: 7.5

## 2011-06-03 LAB — CBC
MCHC: 34.6
Platelets: 262
RDW: 15.7 — ABNORMAL HIGH

## 2011-07-29 ENCOUNTER — Encounter: Payer: Self-pay | Admitting: *Deleted

## 2011-07-29 ENCOUNTER — Emergency Department (HOSPITAL_COMMUNITY)
Admission: EM | Admit: 2011-07-29 | Discharge: 2011-07-30 | Disposition: A | Discharge status: Home / Self Care | Payer: Medicaid Other | Attending: Emergency Medicine | Admitting: Emergency Medicine

## 2011-07-29 ENCOUNTER — Emergency Department (HOSPITAL_COMMUNITY): Payer: Medicaid Other

## 2011-07-29 ENCOUNTER — Other Ambulatory Visit: Payer: Self-pay

## 2011-07-29 DIAGNOSIS — J42 Unspecified chronic bronchitis: Secondary | ICD-10-CM | POA: Insufficient documentation

## 2011-07-29 DIAGNOSIS — R059 Cough, unspecified: Secondary | ICD-10-CM | POA: Insufficient documentation

## 2011-07-29 DIAGNOSIS — J40 Bronchitis, not specified as acute or chronic: Secondary | ICD-10-CM | POA: Insufficient documentation

## 2011-07-29 DIAGNOSIS — R0602 Shortness of breath: Secondary | ICD-10-CM | POA: Insufficient documentation

## 2011-07-29 DIAGNOSIS — J45909 Unspecified asthma, uncomplicated: Secondary | ICD-10-CM | POA: Insufficient documentation

## 2011-07-29 DIAGNOSIS — R05 Cough: Secondary | ICD-10-CM | POA: Insufficient documentation

## 2011-07-29 DIAGNOSIS — R079 Chest pain, unspecified: Secondary | ICD-10-CM | POA: Insufficient documentation

## 2011-07-29 LAB — POCT I-STAT TROPONIN I: Troponin i, poc: 0 ng/mL (ref 0.00–0.08)

## 2011-07-29 LAB — BASIC METABOLIC PANEL
CO2: 24 mEq/L (ref 19–32)
GFR calc non Af Amer: >90 mL/min (ref >90)
Glucose, Bld: 85 mg/dL (ref 70–99)
Potassium: 3.3 mEq/L — ABNORMAL LOW (ref 3.5–5.1)
Sodium: 136 mEq/L (ref 135–145)

## 2011-07-29 LAB — CBC
Hemoglobin: 12.5 g/dL (ref 12.0–15.0)
Platelets: 175 10*3/uL (ref 150–400)
RBC: 4.15 MIL/uL (ref 3.87–5.11)

## 2011-07-29 NOTE — ED Notes (Signed)
TRIAGE NOTES/ASSESSMENTS ENTERED BY TRIAGE NURSE NOT NURSE TECH.  

## 2011-07-29 NOTE — ED Notes (Signed)
PT. REPORTS SUBSTERNAL CHEST PAIN ONSET LAST Saturday , SOB WITH PRODUCTIVE COUGH , NO NAUSEA OR VOMITTING , HEADACHE / BODY ACHES AND WEAKNESS.

## 2011-07-30 MED ORDER — AZITHROMYCIN 250 MG PO TABS: 250.0000 mg | ORAL_TABLET | Freq: Every day | ORAL | Status: AC

## 2011-07-30 MED ORDER — AZITHROMYCIN 250 MG PO TABS
500.0000 mg | ORAL_TABLET | Freq: Once | ORAL | Status: AC
Start: 2011-07-30 — End: 2011-07-30
  Administered 2011-07-30: 500 mg via ORAL
  Filled 2011-07-30: qty 2

## 2011-07-30 MED ORDER — HYDROCOD POLST-CHLORPHEN POLST 10-8 MG/5ML PO LQCR: 5.0000 mL | Freq: Two times a day (BID) | ORAL | Status: DC | PRN

## 2011-07-30 NOTE — ED Provider Notes (Addendum)
History     CSN: [id number] Arrival date & time: 07/29/2011  9:19 PM   First MD Initiated Contact with Patient 07/30/11 0116      Chief Complaint  Patient presents with  . Chest Pain    (Consider location/radiation/quality/duration/timing/severity/associated sxs/prior treatment) Patient is a 30 y.o. female presenting with cough. The history is provided by the patient.  Cough This is a new problem. The current episode started more than 1 week ago. The problem occurs constantly. The problem has been gradually worsening. The cough is productive of sputum. The maximum temperature recorded prior to her arrival was 100 to 100.9 F. She has tried nothing for the symptoms. She is not a smoker. Her past medical history is significant for bronchitis.    History reviewed. No pertinent past medical history.  History reviewed. No pertinent past surgical history.  No family history on file.  History  Substance Use Topics  . Smoking status: Not on file  . Smokeless tobacco: Not on file  . Alcohol Use: Not on file    OB History    Grav Para Term Preterm Abortions TAB SAB Ect Mult Living                  Review of Systems  Respiratory: Positive for cough.   All other systems reviewed and are negative.    Allergies  Acetaminophen  Home Medications   Current Outpatient Rx  Name Route Sig Dispense Refill  . ALBUTEROL SULFATE HFA 108 (90 BASE) MCG/ACT IN AERS Inhalation Inhale 2 puffs into the lungs every 6 (six) hours as needed. Shortness of breath       BP 106/71  Pulse 94  Temp(Src) 99.8 F (37.7 C) (Oral)  Resp 24  SpO2 94%  LMP 05/29/2011  Physical Exam  Nursing note and vitals reviewed. Constitutional: She is oriented to person, place, and time. She appears well-developed and well-nourished. No distress.  HENT:  Head: Normocephalic and atraumatic.  Neck: Normal range of motion. Neck supple.  Cardiovascular: Normal rate and regular rhythm.  Exam reveals no gallop  and no friction rub.   No murmur heard. Pulmonary/Chest: Effort normal and breath sounds normal. No respiratory distress. She has no wheezes.  Abdominal: Soft. Bowel sounds are normal. She exhibits no distension. There is no tenderness.  Musculoskeletal: Normal range of motion.  Neurological: She is alert and oriented to person, place, and time.  Skin: Skin is warm and dry. She is not diaphoretic.    ED Course  Procedures (including critical care time)  Labs Reviewed  BASIC METABOLIC PANEL - Abnormal; Notable for the following:    Potassium 3.3 (*)    BUN 5 (*)    All other components within normal limits  CBC  POCT I-STAT TROPONIN I  I-STAT TROPONIN I   Dg Chest 2 View  07/29/2011  *RADIOLOGY REPORT*  Clinical Data: Chest pain, shortness of breath, cough, asthma, chronic bronchitis.  CHEST - 2 VIEW  Comparison: None.  Findings: Normal sized heart.  Clear lungs.  Mild central peribronchial thickening.  Minimal scoliosis.  Cholecystectomy clips.  IMPRESSION: Mild bronchitic changes.  Original Report Authenticated By: Darrol Angel, M.D.     No diagnosis found.   Date: 07/30/2011  Rate: 94  Rhythm: normal sinus rhythm  QRS Axis: normal  Intervals: normal  ST/T Wave abnormalities: normal  Conduction Disutrbances:none  Narrative Interpretation:   Old EKG Reviewed: none available    MDM  Xrays show bronchitic changes.  Will  treat with zmax, follow up if worsens.        Geoffery Lyons, MD 07/30/11 1610  Geoffery Lyons, MD 11/07/11 1500

## 2011-08-21 ENCOUNTER — Ambulatory Visit: Payer: Self-pay | Admitting: Obstetrics and Gynecology

## 2012-06-19 ENCOUNTER — Other Ambulatory Visit (HOSPITAL_COMMUNITY)
Admission: RE | Admit: 2012-06-19 | Discharge: 2012-06-19 | Disposition: A | Discharge status: Home / Self Care | Payer: Medicaid Other | Source: Ambulatory Visit | Attending: Obstetrics and Gynecology | Admitting: Obstetrics and Gynecology

## 2012-06-19 ENCOUNTER — Encounter: Payer: Self-pay | Admitting: Obstetrics and Gynecology

## 2012-06-19 ENCOUNTER — Ambulatory Visit (INDEPENDENT_AMBULATORY_CARE_PROVIDER_SITE_OTHER): Payer: Medicaid Other | Admitting: Obstetrics and Gynecology

## 2012-06-19 VITALS — BP 130/80 | HR 61 | Temp 97.5°F | Ht 63.0 in | Wt 108.1 lb

## 2012-06-19 DIAGNOSIS — F341 Dysthymic disorder: Secondary | ICD-10-CM

## 2012-06-19 DIAGNOSIS — Z1151 Encounter for screening for human papillomavirus (HPV): Secondary | ICD-10-CM | POA: Insufficient documentation

## 2012-06-19 DIAGNOSIS — Z01419 Encounter for gynecological examination (general) (routine) without abnormal findings: Secondary | ICD-10-CM

## 2012-06-19 DIAGNOSIS — N870 Mild cervical dysplasia: Secondary | ICD-10-CM | POA: Insufficient documentation

## 2012-06-19 DIAGNOSIS — F329 Major depressive disorder, single episode, unspecified: Secondary | ICD-10-CM

## 2012-06-19 DIAGNOSIS — F419 Anxiety disorder, unspecified: Secondary | ICD-10-CM

## 2012-06-19 DIAGNOSIS — R51 Headache: Secondary | ICD-10-CM

## 2012-06-19 DIAGNOSIS — Z113 Encounter for screening for infections with a predominantly sexual mode of transmission: Secondary | ICD-10-CM | POA: Insufficient documentation

## 2012-06-19 DIAGNOSIS — F32A Depression, unspecified: Secondary | ICD-10-CM

## 2012-06-19 DIAGNOSIS — Z124 Encounter for screening for malignant neoplasm of cervix: Secondary | ICD-10-CM

## 2012-06-19 HISTORY — DX: Anxiety disorder, unspecified: F41.9

## 2012-06-19 HISTORY — DX: Depression, unspecified: F32.A

## 2012-06-19 LAB — POCT URINALYSIS DIP (DEVICE)
Glucose, UA: NEGATIVE mg/dL
Ketones, ur: NEGATIVE mg/dL
Leukocytes, UA: NEGATIVE
Protein, ur: NEGATIVE mg/dL
Specific Gravity, Urine: 1.025 (ref 1.005–1.030)
Urobilinogen, UA: 0.2 mg/dL (ref 0.0–1.0)

## 2012-06-19 NOTE — Progress Notes (Signed)
  Subjective:     Kathryn Norman is a 31 y.o. female AAF [phone] and is here for annual GYN exam. The patient reports chronic headaches lasting 4 days at a time and responsive to Excedrin migraine. She occasionally uses her sister's Percocet for headache. She's also endorsing anxiety and depression and has started her sister's supply of Paxil 40 mg 1 a day and has had much improvement of her symptoms. She's taken the Paxil for one month prior to that she was skipping meals, having weight loss and not clean her house. In addition she had some episodes where she felt like chilling herself. She has no suicidal ideation now. In addition she has not had a Pap since March of 2010 that Pap was normal but she gives a history of having had mild dysplasia in the past. She also reports unintentional weight loss in the past year however she has gained 8 pounds since she started on Paxil. Alsdo has urinary frequency and urgency without dysuria. Works in [employer].  GYN: Hx: as above. Denies STIs. Single sexual partner. Happy with IUD (in x3 yrs). Menses regular without dysmenorrhea or abnormal bleeding.  OB HX: NSVD x3. Hx cerclage.  History   Social History  . Marital Status: Single    Spouse Name: N/A    Number of Children: N/A  . Years of Education: N/A   Occupational History  . Not on file.   Social History Main Topics  . Smoking status: Current Every Day Smoker -- 0.1 packs/day    Types: Cigarettes    Start date: 10/21/2011  . Smokeless tobacco: Never Used  . Alcohol Use: No  . Drug Use: No  . Sexually Active: No   Other Topics Concern  . Not on file   Social History Narrative  . No narrative on file   Health Maintenance  Topic Date Due  . Pap Smear  04/02/1998  . Tetanus/tdap  04/03/1999  . Influenza Vaccine  04/26/2012    The following portions of the patient's history were reviewed and updated as appropriate: allergies, current medications, past family history, past medical history,  past social history, past surgical history and problem list.  Review of Systems Pertinent items are noted in HPI.   Objective:    BP 130/80  Pulse 61  Temp 97.5 F (36.4 C) (Oral)  Ht 5\' 3"  (1.6 m)  Wt 108 lb 1.6 oz (49.034 kg)  BMI 19.15 kg/m2   Gen: Thin, pleasant, NAD HEENT: Poor dentition; thyroid not enlarged Lungs: CTAB Cor: RR w/o murmur Breasts: symmetric, nipples erect without discharge, no discrete masses, no lymphadenopathy Abdomen: Sof,t flat, nontender, no masses Pelvic: NEFG, there tenons support, vagina pink rugated, cervix nulliparous with IUD strings protruding 1 cm beyond os, no lesions; uterus and FSP, no adnexal tenderness or masses Extremities: No edema Back: No CVAT   Assessment:    Healthy female exam.  Anxiety depression History abnormal Pap Chronic headaches     Plan:  UA, Pap, GC/Chlamydia  Rx Paxil Discuss use of Tylenol up to 4 g per day with onset of headache Counseled on stress reduction and smoking cessation See After Visit Summary for Counseling Recommendations  Return yearly and when necessary To internal medicine for chronic headaches and anxiety/ depression Danae Orleans, CNM 06/19/2012 10:25 AM

## 2012-06-19 NOTE — Addendum Note (Signed)
Addended by: Franchot Mimes on: 06/19/2012 10:52 AM   Modules accepted: Orders

## 2012-06-19 NOTE — Patient Instructions (Signed)
Place annual gynecologic exam patient instructions here.

## 2012-08-12 ENCOUNTER — Encounter: Payer: Self-pay | Admitting: Advanced Practice Midwife

## 2012-08-12 ENCOUNTER — Ambulatory Visit (INDEPENDENT_AMBULATORY_CARE_PROVIDER_SITE_OTHER): Payer: Medicaid Other | Admitting: Advanced Practice Midwife

## 2012-08-12 VITALS — BP 120/76 | HR 70 | Temp 98.7°F | Ht 63.0 in | Wt 105.4 lb

## 2012-08-12 DIAGNOSIS — R634 Abnormal weight loss: Secondary | ICD-10-CM

## 2012-08-12 LAB — TSH: TSH: 0.888 u[IU]/mL (ref 0.350–4.500)

## 2012-08-12 NOTE — Progress Notes (Signed)
  Subjective:    Patient ID: Kathryn Norman, female    DOB: Aug 24, 1981, 31 y.o.   MRN: [id number]  HPI 31 y.o. [id number] presenting with "lump" in middle of chest. 2 weeks ago noticed swelling in left axilla, then felt like left breast was engorged and had nipple discharge that looked like milk, that completely resolved, then noticed nontender lump in middle lower area of chest wall. Also reports recent unintentional weight loss.     Review of Systems  Constitutional: Positive for unexpected weight change (weight loss). Negative for activity change and appetite change.  HENT: Negative.   Eyes: Negative.   Respiratory: Negative.   Cardiovascular: Negative.   Gastrointestinal: Negative.   Genitourinary: Negative.   Musculoskeletal: Negative.   Neurological: Negative.   Hematological: Negative.   Psychiatric/Behavioral: Negative.        Objective:   Physical Exam  Nursing note and vitals reviewed. Constitutional: She is oriented to person, place, and time. She appears well-developed and well-nourished. No distress.  Neck: No tracheal deviation present. No thyromegaly present.  Cardiovascular: Normal rate.   Pulmonary/Chest: Effort normal. She exhibits no tenderness. Right breast exhibits no inverted nipple, no mass, no nipple discharge, no skin change and no tenderness. Left breast exhibits no inverted nipple, no mass, no nipple discharge, no skin change and no tenderness. Breasts are symmetrical.    Abdominal: Soft. Bowel sounds are normal.  Musculoskeletal: Normal range of motion.  Neurological: She is alert and oriented to person, place, and time.  Skin: Skin is warm and dry.  Psychiatric: She has a normal mood and affect.          Assessment & Plan:  No breast mass, palpable bony prominence at end of sternum Unintentional weight loss - TSH ordered today F/u PRN

## 2012-09-04 ENCOUNTER — Encounter (HOSPITAL_COMMUNITY): Payer: Self-pay | Admitting: Emergency Medicine

## 2012-09-04 ENCOUNTER — Emergency Department (HOSPITAL_COMMUNITY): Payer: Medicaid Other

## 2012-09-04 ENCOUNTER — Emergency Department (HOSPITAL_COMMUNITY)
Admission: EM | Admit: 2012-09-04 | Discharge: 2012-09-04 | Disposition: A | Discharge status: Home / Self Care | Payer: Medicaid Other | Attending: Emergency Medicine | Admitting: Emergency Medicine

## 2012-09-04 DIAGNOSIS — Z862 Personal history of diseases of the blood and blood-forming organs and certain disorders involving the immune mechanism: Secondary | ICD-10-CM | POA: Insufficient documentation

## 2012-09-04 DIAGNOSIS — Z3202 Encounter for pregnancy test, result negative: Secondary | ICD-10-CM | POA: Insufficient documentation

## 2012-09-04 DIAGNOSIS — R109 Unspecified abdominal pain: Secondary | ICD-10-CM | POA: Insufficient documentation

## 2012-09-04 DIAGNOSIS — N920 Excessive and frequent menstruation with regular cycle: Secondary | ICD-10-CM | POA: Insufficient documentation

## 2012-09-04 DIAGNOSIS — R197 Diarrhea, unspecified: Secondary | ICD-10-CM | POA: Insufficient documentation

## 2012-09-04 DIAGNOSIS — Z79899 Other long term (current) drug therapy: Secondary | ICD-10-CM | POA: Insufficient documentation

## 2012-09-04 DIAGNOSIS — Z8659 Personal history of other mental and behavioral disorders: Secondary | ICD-10-CM | POA: Insufficient documentation

## 2012-09-04 DIAGNOSIS — F17200 Nicotine dependence, unspecified, uncomplicated: Secondary | ICD-10-CM | POA: Insufficient documentation

## 2012-09-04 DIAGNOSIS — N83209 Unspecified ovarian cyst, unspecified side: Secondary | ICD-10-CM

## 2012-09-04 DIAGNOSIS — Z975 Presence of (intrauterine) contraceptive device: Secondary | ICD-10-CM | POA: Insufficient documentation

## 2012-09-04 DIAGNOSIS — Z8679 Personal history of other diseases of the circulatory system: Secondary | ICD-10-CM | POA: Insufficient documentation

## 2012-09-04 DIAGNOSIS — N831 Corpus luteum cyst of ovary, unspecified side: Secondary | ICD-10-CM | POA: Insufficient documentation

## 2012-09-04 DIAGNOSIS — J45909 Unspecified asthma, uncomplicated: Secondary | ICD-10-CM | POA: Insufficient documentation

## 2012-09-04 LAB — CBC WITH DIFFERENTIAL/PLATELET
Eosinophils Relative: 0 % (ref 0–5)
HCT: 37.4 % (ref 36.0–46.0)
Lymphocytes Relative: 4 % — ABNORMAL LOW (ref 12–46)
Lymphs Abs: 0.8 10*3/uL (ref 0.7–4.0)
MCV: 89.3 fL (ref 78.0–100.0)
Platelets: 222 10*3/uL (ref 150–400)
RBC: 4.19 MIL/uL (ref 3.87–5.11)
WBC: 21.1 10*3/uL — ABNORMAL HIGH (ref 4.0–10.5)

## 2012-09-04 LAB — URINALYSIS, ROUTINE W REFLEX MICROSCOPIC
Glucose, UA: NEGATIVE mg/dL
Ketones, ur: >80 mg/dL — AB
Leukocytes, UA: NEGATIVE
pH: 5.5 (ref 5.0–8.0)

## 2012-09-04 LAB — COMPREHENSIVE METABOLIC PANEL
ALT: 11 U/L (ref 0–35)
AST: 17 U/L (ref 0–37)
Alkaline Phosphatase: 68 U/L (ref 39–117)
CO2: 21 mEq/L (ref 19–32)
Chloride: 99 mEq/L (ref 96–112)
Creatinine, Ser: 0.73 mg/dL (ref 0.50–1.10)
GFR calc non Af Amer: >90 mL/min (ref >90)
Potassium: 3.4 mEq/L — ABNORMAL LOW (ref 3.5–5.1)
Sodium: 134 mEq/L — ABNORMAL LOW (ref 135–145)
Total Bilirubin: 1 mg/dL (ref 0.3–1.2)

## 2012-09-04 LAB — URINE MICROSCOPIC-ADD ON

## 2012-09-04 MED ORDER — AZITHROMYCIN 250 MG PO TABS
ORAL_TABLET | ORAL | Status: AC
  Administered 2012-09-04: 1000 mg via ORAL
  Filled 2012-09-04: qty 4

## 2012-09-04 MED ORDER — MORPHINE SULFATE 4 MG/ML IJ SOLN
6.0000 mg | Freq: Once | INTRAMUSCULAR | Status: AC
  Administered 2012-09-04: 6 mg via INTRAVENOUS
  Filled 2012-09-04: qty 2

## 2012-09-04 MED ORDER — IOHEXOL 300 MG/ML  SOLN
100.0000 mL | Freq: Once | INTRAMUSCULAR | Status: AC | PRN
  Administered 2012-09-04: 100 mL via INTRAVENOUS

## 2012-09-04 MED ORDER — ONDANSETRON 4 MG PO TBDP
4.0000 mg | ORAL_TABLET | Freq: Once | ORAL | Status: AC
  Administered 2012-09-04: 4 mg via ORAL
  Filled 2012-09-04: qty 1

## 2012-09-04 MED ORDER — PERCOCET 5-325 MG PO TABS: 1.0000 | ORAL_TABLET | Freq: Four times a day (QID) | ORAL | Status: DC | PRN

## 2012-09-04 MED ORDER — MORPHINE SULFATE 4 MG/ML IJ SOLN
8.0000 mg | Freq: Once | INTRAMUSCULAR | Status: AC
  Administered 2012-09-04: 8 mg via INTRAVENOUS
  Filled 2012-09-04: qty 2

## 2012-09-04 MED ORDER — METRONIDAZOLE 500 MG PO TABS
2000.0000 mg | ORAL_TABLET | Freq: Once | ORAL | Status: AC
  Administered 2012-09-04: 2000 mg via ORAL
  Filled 2012-09-04: qty 4

## 2012-09-04 MED ORDER — SODIUM CHLORIDE 0.9 % IV BOLUS (SEPSIS)
500.0000 mL | Freq: Once | INTRAVENOUS | Status: AC
  Administered 2012-09-04: 500 mL via INTRAVENOUS

## 2012-09-04 MED ORDER — AZITHROMYCIN 250 MG PO TABS
1000.0000 mg | ORAL_TABLET | Freq: Once | ORAL | Status: AC
  Administered 2012-09-04: 1000 mg via ORAL

## 2012-09-04 MED ORDER — ACETAMINOPHEN 325 MG PO TABS
650.0000 mg | ORAL_TABLET | Freq: Once | ORAL | Status: AC
  Administered 2012-09-04: 650 mg via ORAL
  Filled 2012-09-04: qty 2

## 2012-09-04 MED ORDER — SODIUM CHLORIDE 0.9 % IV BOLUS (SEPSIS)
2000.0000 mL | Freq: Once | INTRAVENOUS | Status: AC
  Administered 2012-09-04: 1000 mL via INTRAVENOUS

## 2012-09-04 MED ORDER — AZITHROMYCIN 1 G PO PACK
1.0000 g | PACK | Freq: Once | ORAL | Status: DC
  Filled 2012-09-04: qty 1

## 2012-09-04 MED ORDER — DEXTROSE 5 % IV SOLN
1.0000 g | INTRAVENOUS | Status: DC
  Administered 2012-09-04: 1 g via INTRAVENOUS
  Filled 2012-09-04: qty 10

## 2012-09-04 NOTE — ED Notes (Signed)
[id number]> Expected date:<BR> Expected time:<BR> Means of arrival:<BR> Comments:<BR> ems

## 2012-09-04 NOTE — ED Provider Notes (Signed)
History     CSN: [id number]  Arrival date & time 09/04/12  4098   First MD Initiated Contact with Patient 09/04/12 [phone]      Chief Complaint  Patient presents with  . Abdominal Pain    (Consider location/radiation/quality/duration/timing/severity/associated sxs/prior treatment) HPI Comments: Kathryn Norman is a 32 y.o. G76P2 female with a history of cholystectomy that presents with acute onset of diffuse abdominal pain that woke her up from her sleep at 3am this morning. Pain has been constant since that time.  She states when the pain started she had a fever of 100 degrees F and diarrhea x 1.  Pain is located midline from her epigastric region to her suprapubic region.  She describes pain as stabbing and throbbing.  Walking and sitting aggravate the pain.  When she sits in the fetal position she has some relief of pain.  On a scale of 1 to 10, she rates her pain a 12.  She had a mirena placed three years ago and has had no complications from it.  Patient denies any recent illness. LMP is current with start 08/31/2012. This cycle has been unusual that she is having increased bleeding. In addition she reports large gross blood, possibly vaginal after urination while in the ER.   Patient is a 32 y.o. female presenting with abdominal pain. The history is provided by the patient.  Abdominal Pain The primary symptoms of the illness include abdominal pain, fever, diarrhea and vaginal bleeding. The primary symptoms of the illness do not include nausea, vomiting or dysuria.  Symptoms associated with the illness do not include diaphoresis or frequency.    Past Medical History  Diagnosis Date  . Depression   . Migraines   . Anxiety   . ADHD (attention deficit hyperactivity disorder)   . Anemia   . Asthma     Past Surgical History  Procedure Date  . Dilation and curettage of uterus     x 2  . Cholecystectomy     Family History  Problem Relation Age of Onset  . Migraines Mother   .  Alopecia Mother   . Sickle cell trait Mother   . Cancer Father     died of lung cancer  . Sickle cell anemia Sister   . Seizures Brother     History  Substance Use Topics  . Smoking status: Current Every Day Smoker -- 0.1 packs/day    Types: Cigarettes    Start date: 10/21/2011  . Smokeless tobacco: Never Used  . Alcohol Use: No    OB History    Grav Para Term Preterm Abortions TAB SAB Ect Mult Living   6 3 2 1 3  3   3       Review of Systems  Constitutional: Positive for fever. Negative for diaphoresis and activity change.  HENT: Negative for congestion and neck pain.   Respiratory: Negative for cough.   Cardiovascular: Negative for leg swelling.  Gastrointestinal: Positive for abdominal pain and diarrhea. Negative for nausea, vomiting and blood in stool.  Genitourinary: Positive for vaginal bleeding and menstrual problem. Negative for dysuria, frequency and difficulty urinating.  Musculoskeletal: Negative for myalgias.  Skin: Negative for color change and wound.  Neurological: Negative for headaches.  All other systems reviewed and are negative.    Allergies  Acetaminophen  Home Medications   Current Outpatient Rx  Name  Route  Sig  Dispense  Refill  . ALBUTEROL SULFATE HFA 108 (90 BASE) MCG/ACT IN AERS  Inhalation   Inhale 2 puffs into the lungs every 6 (six) hours as needed. Shortness of breath          . IBUPROFEN 200 MG PO TABS   Oral   Take 400 mg by mouth every 8 (eight) hours as needed. For pain.           BP 110/66  Pulse 82  Temp 98.3 F (36.8 C) (Oral)  Resp 20  SpO2 100%  LMP 09/04/2012  Physical Exam  Nursing note and vitals reviewed. Constitutional: She is oriented to person, place, and time. She appears well-developed and well-nourished. She appears distressed.  HENT:  Head: Normocephalic and atraumatic.  Eyes: Conjunctivae normal and EOM are normal.  Neck: Normal range of motion.  Cardiovascular: Normal rate, regular rhythm  and intact distal pulses.   Pulmonary/Chest: Effort normal.  Abdominal: Soft.       Severe abdominal ttp primarily in the RLQ, suprapubic and midline. Bowel sounds present.   Genitourinary:       Exam chaperoned. Longer then normal merina cord from cervical os. Significant amount of blood in vaginal vault. No purulent drainage from os. Right adnexal and mild cervical motion tenderness present.   Musculoskeletal: Normal range of motion.  Neurological: She is alert and oriented to person, place, and time.  Skin: Skin is warm and dry. No rash noted. She is not diaphoretic.  Psychiatric: She has a normal mood and affect. Her behavior is normal.    ED Course  Procedures (including critical care time)  Labs Reviewed  CBC WITH DIFFERENTIAL - Abnormal; Notable for the following:    WBC 21.1 (*)     Neutrophils Relative 94 (*)     Neutro Abs 19.8 (*)     Lymphocytes Relative 4 (*)     All other components within normal limits  URINALYSIS, ROUTINE W REFLEX MICROSCOPIC - Abnormal; Notable for the following:    Color, Urine AMBER (*)  BIOCHEMICALS MAY BE AFFECTED BY COLOR   APPearance CLOUDY (*)     Specific Gravity, Urine 1.037 (*)     Hgb urine dipstick LARGE (*)     Bilirubin Urine SMALL (*)     Ketones, ur >80 (*)     Protein, ur 30 (*)     All other components within normal limits  COMPREHENSIVE METABOLIC PANEL - Abnormal; Notable for the following:    Sodium 134 (*)     Potassium 3.4 (*)     All other components within normal limits  LIPASE, BLOOD - Abnormal; Notable for the following:    Lipase 6 (*)     All other components within normal limits  WET PREP, GENITAL - Abnormal; Notable for the following:    Yeast Wet Prep HPF POC RARE (*)     Clue Cells Wet Prep HPF POC FEW (*)     WBC, Wet Prep HPF POC FEW (*)     All other components within normal limits  URINE MICROSCOPIC-ADD ON - Abnormal; Notable for the following:    Bacteria, UA FEW (*)     All other components within  normal limits  PREGNANCY, URINE  LACTIC ACID, PLASMA  GC/CHLAMYDIA PROBE AMP  CULTURE, BLOOD (ROUTINE X 2)  CULTURE, BLOOD (ROUTINE X 2)   US Transvaginal Non-ob  09/04/2012  *RADIOLOGY REPORT*  Clinical Data: 32 year old GE six P 3 AB2, LMP 08/30/2012, presenting with right-sided pelvic pain and right lower quadrant abdominal pain and fever.  Indwelling Merena intrauterine device.  TRANSABDOMINAL AND TRANSVAGINAL ULTRASOUND OF PELVIS  Technique:  Both transabdominal and transvaginal ultrasound examinations of the pelvis were performed. Transabdominal technique was performed for global imaging of the pelvis including uterus, ovaries, adnexal regions, and pelvic cul-de-sac.  It was necessary to proceed with endovaginal exam following the transabdominal exam to visualize the uterus and ovaries due to incomplete bladder distention.  Comparison:  No previous non-OB pelvic ultrasound.  Findings:  Uterus: Normal in size measuring approximate 7.6 x 3.5 x 5.7 cm. No focal myometrial abnormality.  Endometrium: Normal in thickness measuring 3 mm.  No endometrial fluid or mass.  Intrauterine device in the endometrium of the lower uterine segment just above the internal os.  Right ovary:  Normal in size measuring approximate 2.9 x 1.5 x 2.0 cm, containing a complex cyst which measures approximately 2.1 x 0.9 x 1.0 cm (no internal color Doppler signal within this oval shaped hypoechoic mass).  Normal color Doppler signal to the remainder of the ovary.  No solid mass.  Left ovary: Normal in size measuring approximate 2.6 x 1.2 x 2.1 cm, containing small follicular cysts.  No dominant cyst or solid mass.  Normal color Doppler signal within the ovary.  Other findings: Trace, likely physiologic free fluid in the pelvis.  IMPRESSION:  1.  Intrauterine device positioned low in the endometrium of the lower uterine segment, just above the internal os. 2.  No evidence of endometrial fluid. 3.  Approximate 2 cm complex, likely  hemorrhagic cyst in the right ovary 4.  Trace physiologic free fluid in the cul-de-sac.   Original Report Authenticated By: Hulan Saas, M.D.    US Pelvis Complete  09/04/2012  *RADIOLOGY REPORT*  Clinical Data: 32 year old GE six P 3 AB2, LMP 08/30/2012, presenting with right-sided pelvic pain and right lower quadrant abdominal pain and fever.  Indwelling Merena intrauterine device.  TRANSABDOMINAL AND TRANSVAGINAL ULTRASOUND OF PELVIS  Technique:  Both transabdominal and transvaginal ultrasound examinations of the pelvis were performed. Transabdominal technique was performed for global imaging of the pelvis including uterus, ovaries, adnexal regions, and pelvic cul-de-sac.  It was necessary to proceed with endovaginal exam following the transabdominal exam to visualize the uterus and ovaries due to incomplete bladder distention.  Comparison:  No previous non-OB pelvic ultrasound.  Findings:  Uterus: Normal in size measuring approximate 7.6 x 3.5 x 5.7 cm. No focal myometrial abnormality.  Endometrium: Normal in thickness measuring 3 mm.  No endometrial fluid or mass.  Intrauterine device in the endometrium of the lower uterine segment just above the internal os.  Right ovary:  Normal in size measuring approximate 2.9 x 1.5 x 2.0 cm, containing a complex cyst which measures approximately 2.1 x 0.9 x 1.0 cm (no internal color Doppler signal within this oval shaped hypoechoic mass).  Normal color Doppler signal to the remainder of the ovary.  No solid mass.  Left ovary: Normal in size measuring approximate 2.6 x 1.2 x 2.1 cm, containing small follicular cysts.  No dominant cyst or solid mass.  Normal color Doppler signal within the ovary.  Other findings: Trace, likely physiologic free fluid in the pelvis.  IMPRESSION:  1.  Intrauterine device positioned low in the endometrium of the lower uterine segment, just above the internal os. 2.  No evidence of endometrial fluid. 3.  Approximate 2 cm complex, likely  hemorrhagic cyst in the right ovary 4.  Trace physiologic free fluid in the cul-de-sac.   Original Report Authenticated By: Hulan Saas, M.D.  Ct Abdomen Pelvis W Contrast  09/04/2012  *RADIOLOGY REPORT*  Clinical Data: Lower abdominal pain for 24 hours.  This is eccentric to the right side.  CT ABDOMEN AND PELVIS WITH CONTRAST  Technique:  Multidetector CT imaging of the abdomen and pelvis was performed following the standard protocol during bolus administration of intravenous contrast.  Contrast: OMNIPAQUE IOHEXOL 300 MG/ML  SOLN  Comparison: Ultrasound examination of 09/04/2012  Findings: The liver, spleen, pancreas, and adrenal glands appear unremarkable.  Gallbladder surgically absent. The kidneys appear unremarkable, as do the proximal ureters.  The paucity of intra-abdominal adipose tissue, making separation of adjacent structures problematic.  As noted previously, the IUD appears low in position, with its tip the stem in the cervix.  Urinary bladder unremarkable.  A structure extending immediately caudad to the cecum on images 33-31 of series 5 is thought to likely represent the appendix, and measures 6 mm in thickness.  Air-fluid levels are observed in the descending colon, and can be normal in this location.  Borderline wall thickening in the distal descending colon and sigmoid colon, both of which are nondistended. No dilated small bowel observed.  IMPRESSION:  1.  A structure believed represent the appendix is unremarkable. No dilated bowel observed. 2.  Air-fluid levels are present in the descending colon, but can be normal in this location. 3.  IUD is low in position, with tip of the stem in the cervix.   Original Report Authenticated By: Gaylyn Rong, M.D.      No diagnosis found.    MDM  32 year old female presents emergency department complaining of acute onset of abdominal pain complicated by vaginal bleeding.  Patient arrived to ER febrile and distressed. IVF and pain  medications given. Labs and imaging reviewed showing leukocytosis, dehydration, questionable wet prep results, right hemorrhagic cyst, low lying IUD and normal appendix.  Due to inability to collect appropriate cervical sample because of the amount of blood in the vaginal vault I will prophylactically treat patient with azithromycin, Rocephin, and Flagyl.  Patient understands that she is cultures pending.  Discussed importance of following up with OB/GYN as soon as possible for further assessment of IUD as well as followup on hemorrhagic cyst.  Further STD testing is also recommended.  Patient will be discharged with pain medication.  Jaci Carrel, [place] 09/04/12 1721

## 2012-09-04 NOTE — ED Notes (Signed)
Per EMS-pt c/o of lower abd pain x24 hours. States that she is on her menstrual cycle. No nausea/vomiting. NAD at this time.

## 2012-09-04 NOTE — ED Notes (Signed)
Pt to US.

## 2012-09-07 ENCOUNTER — Encounter: Payer: Self-pay | Admitting: Obstetrics and Gynecology

## 2012-09-07 ENCOUNTER — Ambulatory Visit (INDEPENDENT_AMBULATORY_CARE_PROVIDER_SITE_OTHER): Payer: Medicaid Other | Admitting: Obstetrics and Gynecology

## 2012-09-07 VITALS — BP 134/82 | HR 83 | Temp 98.9°F | Ht 63.0 in | Wt 106.0 lb

## 2012-09-07 DIAGNOSIS — T8332XA Displacement of intrauterine contraceptive device, initial encounter: Secondary | ICD-10-CM

## 2012-09-07 DIAGNOSIS — N83209 Unspecified ovarian cyst, unspecified side: Secondary | ICD-10-CM

## 2012-09-07 DIAGNOSIS — Z30433 Encounter for removal and reinsertion of intrauterine contraceptive device: Secondary | ICD-10-CM

## 2012-09-07 DIAGNOSIS — Z01812 Encounter for preprocedural laboratory examination: Secondary | ICD-10-CM

## 2012-09-07 HISTORY — DX: Unspecified ovarian cyst, unspecified side: N83.209

## 2012-09-07 LAB — POCT PREGNANCY, URINE: Preg Test, Ur: NEGATIVE

## 2012-09-07 MED ORDER — LEVONORGESTREL 20 MCG/24HR IU IUD
INTRAUTERINE_SYSTEM | Freq: Once | INTRAUTERINE | Status: AC
  Administered 2012-09-07: 1 via INTRAUTERINE

## 2012-09-07 NOTE — Progress Notes (Signed)
  Subjective:    Patient ID: Kathryn Norman, female    DOB: Oct 11, 1980, 32 y.o.   MRN: [id number]  HPI 32 yo [id number] with LMP 09/04/2012 presenting today as ED follow-up for malpositioned IUD and hemorrhagic cyst. Patient reports sudden onset of pelvic pain on 1/10 which brought her to the ED. Patient reports pain is persistent but slowly improving. Patient reports no vaginal bleeding with Mirena IUD until 1/10 where she describes it as heavy with passage of clots and associated with cramping pains. Patient states she has been happy with Mirena IUD until now and would like to have a new one inserted again. Patient is otherwise doing well and is without any complaints.   Past Medical History  Diagnosis Date  . Depression   . Migraines   . Anxiety   . ADHD (attention deficit hyperactivity disorder)   . Anemia   . Asthma    Past Surgical History  Procedure Date  . Dilation and curettage of uterus     x 2  . Cholecystectomy    Family History  Problem Relation Age of Onset  . Migraines Mother   . Alopecia Mother   . Sickle cell trait Mother   . Cancer Father     died of lung cancer  . Sickle cell anemia Sister   . Seizures Brother    History  Substance Use Topics  . Smoking status: Current Every Day Smoker -- 0.1 packs/day    Types: Cigarettes    Start date: 10/21/2011  . Smokeless tobacco: Never Used  . Alcohol Use: No      Review of Systems  All other systems reviewed and are negative.       Objective:   Physical Exam  GENERAL: Well-developed, well-nourished female in no acute distress.  ABDOMEN: Soft, nontender, nondistended. No organomegaly. PELVIC: Normal external female genitalia. Vagina is pink and rugated.  Normal discharge. Normal appearing cervix with 5 cm IUD strings protruding from cervical os. Uterus is normal in size. No adnexal mass or tenderness. EXTREMITIES: No cyanosis, clubbing, or edema, 2+ distal pulses.  1/10 pelvic ultrasound: IMPRESSION:  1.  Intrauterine device positioned low in the endometrium of the  lower uterine segment, just above the internal os.  2. No evidence of endometrial fluid.  3. Approximate 2 cm complex, likely hemorrhagic cyst in the right ovary  4. Trace physiologic free fluid in the cul-de-sac.     Assessment & Plan:  32 yo with malpositioned IUD and right hemorrhagic cyst - 2 cm hemorrhagic cyst will resolved- no need for follow-up - Patient counseled on all forms of birth control and desires new Mirena IUD to be inserted.  IUD Procedure Note Patient identified, informed consent performed, signed copy in chart, time out was performed.  Urine pregnancy test negative.  Speculum placed in the vagina.  Cervix visualized.  IUD strings visualized and grasped with ring forceps. IUD was removed without any difficulty. Cervix was cleaned with Betadine x 2.  Grasped anteriorly with a single tooth tenaculum.  Uterus sounded to 7.5 cm.  Mirena IUD placed per manufacturer's recommendations.  Strings trimmed to 3 cm. Tenaculum was removed, good hemostasis noted.  Patient tolerated procedure well.   Patient given post procedure instructions and Mirena care card with expiration date.  Patient is asked to check IUD strings periodically and follow up in 4-6 weeks for IUD check.

## 2012-09-08 ENCOUNTER — Encounter: Payer: Self-pay | Admitting: *Deleted

## 2012-09-08 LAB — GC/CHLAMYDIA PROBE AMP
CT Probe RNA: NEGATIVE
GC Probe RNA: POSITIVE — AB

## 2012-09-08 NOTE — ED Provider Notes (Signed)
Medical screening examination/treatment/procedure(s) were performed by non-physician practitioner and as supervising physician I was immediately available for consultation/collaboration.  Yamato Kopf J. Gildo Crisco, MD 09/08/12 1400 

## 2012-09-09 NOTE — ED Notes (Signed)
+  Gonorrhea. Chart sent to EDP office for review. 

## 2012-09-10 LAB — CULTURE, BLOOD (ROUTINE X 2): Culture: NO GROWTH

## 2012-09-16 NOTE — ED Notes (Signed)
Chart sent to EDP office for review. Rx for Zithromax 2 gram po x 1 per Vedia Pereyra.

## 2012-10-04 ENCOUNTER — Telehealth (HOSPITAL_COMMUNITY): Payer: Self-pay | Admitting: Emergency Medicine

## 2012-10-04 NOTE — ED Notes (Signed)
No response to letter sent after 30 days. Chart sent to Medical Records. °

## 2012-10-09 ENCOUNTER — Ambulatory Visit: Payer: Medicaid Other | Admitting: Medical

## 2012-10-19 ENCOUNTER — Encounter: Payer: Self-pay | Admitting: Medical

## 2012-10-29 ENCOUNTER — Ambulatory Visit: Payer: Medicaid Other | Admitting: Medical

## 2013-08-11 ENCOUNTER — Emergency Department (HOSPITAL_COMMUNITY)
Admission: EM | Admit: 2013-08-11 | Discharge: 2013-08-11 | Disposition: A | Discharge status: Home / Self Care | Payer: No Typology Code available for payment source | Attending: Emergency Medicine | Admitting: Emergency Medicine

## 2013-08-11 ENCOUNTER — Emergency Department (HOSPITAL_COMMUNITY): Payer: No Typology Code available for payment source

## 2013-08-11 ENCOUNTER — Encounter (HOSPITAL_COMMUNITY): Payer: Self-pay | Admitting: Emergency Medicine

## 2013-08-11 DIAGNOSIS — Z862 Personal history of diseases of the blood and blood-forming organs and certain disorders involving the immune mechanism: Secondary | ICD-10-CM | POA: Insufficient documentation

## 2013-08-11 DIAGNOSIS — J45909 Unspecified asthma, uncomplicated: Secondary | ICD-10-CM | POA: Insufficient documentation

## 2013-08-11 DIAGNOSIS — Y92410 Unspecified street and highway as the place of occurrence of the external cause: Secondary | ICD-10-CM | POA: Insufficient documentation

## 2013-08-11 DIAGNOSIS — R519 Headache, unspecified: Secondary | ICD-10-CM

## 2013-08-11 DIAGNOSIS — Z8679 Personal history of other diseases of the circulatory system: Secondary | ICD-10-CM | POA: Insufficient documentation

## 2013-08-11 DIAGNOSIS — Z8659 Personal history of other mental and behavioral disorders: Secondary | ICD-10-CM | POA: Insufficient documentation

## 2013-08-11 DIAGNOSIS — Z79899 Other long term (current) drug therapy: Secondary | ICD-10-CM | POA: Insufficient documentation

## 2013-08-11 DIAGNOSIS — Y9389 Activity, other specified: Secondary | ICD-10-CM | POA: Insufficient documentation

## 2013-08-11 DIAGNOSIS — IMO0002 Reserved for concepts with insufficient information to code with codable children: Secondary | ICD-10-CM | POA: Insufficient documentation

## 2013-08-11 DIAGNOSIS — Z3202 Encounter for pregnancy test, result negative: Secondary | ICD-10-CM | POA: Insufficient documentation

## 2013-08-11 DIAGNOSIS — S0990XA Unspecified injury of head, initial encounter: Secondary | ICD-10-CM | POA: Insufficient documentation

## 2013-08-11 DIAGNOSIS — M549 Dorsalgia, unspecified: Secondary | ICD-10-CM

## 2013-08-11 DIAGNOSIS — F17200 Nicotine dependence, unspecified, uncomplicated: Secondary | ICD-10-CM | POA: Insufficient documentation

## 2013-08-11 MED ORDER — CYCLOBENZAPRINE HCL 10 MG PO TABS: 10.0000 mg | ORAL_TABLET | Freq: Two times a day (BID) | ORAL | Status: DC | PRN

## 2013-08-11 MED ORDER — KETOROLAC TROMETHAMINE 60 MG/2ML IM SOLN
60.0000 mg | Freq: Once | INTRAMUSCULAR | Status: AC
  Administered 2013-08-11: 60 mg via INTRAMUSCULAR
  Filled 2013-08-11: qty 2

## 2013-08-11 MED ORDER — OXYCODONE HCL 5 MG PO CAPS: 5.0000 mg | ORAL_CAPSULE | Freq: Four times a day (QID) | ORAL | Status: DC | PRN
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
## 2013-08-11 NOTE — ED Provider Notes (Signed)
Medical screening examination/treatment/procedure(s) were performed by non-physician practitioner and as supervising physician I was immediately available for consultation/collaboration.  EKG Interpretation   None       Devoria Albe, MD, Armando Gang   Ward Givens, MD 08/11/13 1326
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
## 2014-04-19 ENCOUNTER — Other Ambulatory Visit (HOSPITAL_COMMUNITY)
Admission: RE | Admit: 2014-04-19 | Discharge: 2014-04-19 | Disposition: A | Discharge status: Home / Self Care | Payer: Medicaid Other | Source: Ambulatory Visit | Attending: Obstetrics & Gynecology | Admitting: Obstetrics & Gynecology

## 2014-04-19 ENCOUNTER — Encounter: Payer: Self-pay | Admitting: Obstetrics & Gynecology

## 2014-04-19 ENCOUNTER — Ambulatory Visit (INDEPENDENT_AMBULATORY_CARE_PROVIDER_SITE_OTHER): Payer: Medicaid Other | Admitting: Obstetrics & Gynecology

## 2014-04-19 ENCOUNTER — Ambulatory Visit: Payer: Medicaid Other | Admitting: Obstetrics & Gynecology

## 2014-04-19 VITALS — BP 119/81 | HR 79 | Ht 63.0 in | Wt 107.2 lb

## 2014-04-19 DIAGNOSIS — R634 Abnormal weight loss: Secondary | ICD-10-CM

## 2014-04-19 DIAGNOSIS — Z30432 Encounter for removal of intrauterine contraceptive device: Secondary | ICD-10-CM

## 2014-04-19 DIAGNOSIS — Z1151 Encounter for screening for human papillomavirus (HPV): Secondary | ICD-10-CM

## 2014-04-19 DIAGNOSIS — Z113 Encounter for screening for infections with a predominantly sexual mode of transmission: Secondary | ICD-10-CM

## 2014-04-19 DIAGNOSIS — Z124 Encounter for screening for malignant neoplasm of cervix: Secondary | ICD-10-CM

## 2014-04-19 DIAGNOSIS — Z0000 Encounter for general adult medical examination without abnormal findings: Secondary | ICD-10-CM

## 2014-04-19 DIAGNOSIS — N644 Mastodynia: Secondary | ICD-10-CM

## 2014-04-19 MED ORDER — NORGESTREL-ETHINYL ESTRADIOL 0.3-30 MG-MCG PO TABS: 1.0000 | ORAL_TABLET | Freq: Every day | ORAL | Status: DC

## 2014-04-19 NOTE — Progress Notes (Signed)
   Subjective:    Patient ID: Kathryn Norman, female    DOB: 07-Dec-1980, 33 y.o.   MRN: [id number]  HPI 33 yo S AA G5P3 (13,9, and 5 yo kids) here today because she would like her Mirena removed. She is convinced that it has caused her weight loss and breast pain. She is convinced that she wants it out. She would like OCPs.  Review of Systems     Objective:   Physical Exam  Mirena removed and noted to be intact      Assessment & Plan:  Contraception- Start OCPs, back up method for a month Pap sent

## 2014-04-19 NOTE — Addendum Note (Signed)
Addended by: Allie Bossier on: 04/19/2014 04:19 PM   Modules accepted: Orders

## 2014-04-20 LAB — TSH: TSH: 1.568 u[IU]/mL (ref 0.350–4.500)

## 2014-04-22 LAB — CYTOLOGY - PAP

## 2014-06-27 ENCOUNTER — Encounter: Payer: Self-pay | Admitting: Obstetrics & Gynecology

## 2014-08-09 ENCOUNTER — Emergency Department (HOSPITAL_COMMUNITY)
Admission: EM | Admit: 2014-08-09 | Discharge: 2014-08-09 | Disposition: A | Discharge status: Home / Self Care | Payer: Medicaid Other | Attending: Emergency Medicine | Admitting: Emergency Medicine

## 2014-08-09 ENCOUNTER — Emergency Department (HOSPITAL_COMMUNITY): Payer: Medicaid Other

## 2014-08-09 ENCOUNTER — Encounter (HOSPITAL_COMMUNITY): Payer: Self-pay | Admitting: Physical Medicine and Rehabilitation

## 2014-08-09 DIAGNOSIS — Z3202 Encounter for pregnancy test, result negative: Secondary | ICD-10-CM | POA: Diagnosis not present

## 2014-08-09 DIAGNOSIS — G43909 Migraine, unspecified, not intractable, without status migrainosus: Secondary | ICD-10-CM | POA: Diagnosis not present

## 2014-08-09 DIAGNOSIS — Z8659 Personal history of other mental and behavioral disorders: Secondary | ICD-10-CM | POA: Diagnosis not present

## 2014-08-09 DIAGNOSIS — Z862 Personal history of diseases of the blood and blood-forming organs and certain disorders involving the immune mechanism: Secondary | ICD-10-CM | POA: Insufficient documentation

## 2014-08-09 DIAGNOSIS — J029 Acute pharyngitis, unspecified: Secondary | ICD-10-CM | POA: Insufficient documentation

## 2014-08-09 DIAGNOSIS — Z793 Long term (current) use of hormonal contraceptives: Secondary | ICD-10-CM | POA: Diagnosis not present

## 2014-08-09 DIAGNOSIS — Z79899 Other long term (current) drug therapy: Secondary | ICD-10-CM | POA: Insufficient documentation

## 2014-08-09 DIAGNOSIS — Z720 Tobacco use: Secondary | ICD-10-CM | POA: Diagnosis not present

## 2014-08-09 DIAGNOSIS — J45909 Unspecified asthma, uncomplicated: Secondary | ICD-10-CM | POA: Insufficient documentation

## 2014-08-09 DIAGNOSIS — R05 Cough: Secondary | ICD-10-CM

## 2014-08-09 DIAGNOSIS — N926 Irregular menstruation, unspecified: Secondary | ICD-10-CM

## 2014-08-09 DIAGNOSIS — R059 Cough, unspecified: Secondary | ICD-10-CM

## 2014-08-09 DIAGNOSIS — N938 Other specified abnormal uterine and vaginal bleeding: Secondary | ICD-10-CM | POA: Insufficient documentation

## 2014-08-09 LAB — URINALYSIS, ROUTINE W REFLEX MICROSCOPIC
BILIRUBIN URINE: NEGATIVE
Glucose, UA: NEGATIVE mg/dL
KETONES UR: NEGATIVE mg/dL
LEUKOCYTES UA: NEGATIVE
NITRITE: NEGATIVE
PH: 7 (ref 5.0–8.0)
Protein, ur: NEGATIVE mg/dL
Specific Gravity, Urine: 1.007 (ref 1.005–1.030)
UROBILINOGEN UA: 0.2 mg/dL (ref 0.0–1.0)

## 2014-08-09 LAB — URINE MICROSCOPIC-ADD ON

## 2014-08-09 LAB — POC URINE PREG, ED: Preg Test, Ur: NEGATIVE

## 2014-08-09 MED ORDER — ALBUTEROL SULFATE HFA 108 (90 BASE) MCG/ACT IN AERS
2.0000 | INHALATION_SPRAY | RESPIRATORY_TRACT | Status: DC | PRN
  Administered 2014-08-09: 2 via RESPIRATORY_TRACT
  Filled 2014-08-09: qty 6.7

## 2014-08-09 MED ORDER — GUAIFENESIN 100 MG/5ML PO LIQD: 100.0000 mg | ORAL | Status: DC | PRN

## 2014-08-09 NOTE — ED Notes (Addendum)
Pt presents to department for evaluation of cough, sore throat, vaginal bleeding and urinary frequency. Pt states she notices blood when wiping, states spotting for several months. Pt denies pain at the time. No signs of distress noted.

## 2014-08-09 NOTE — ED Provider Notes (Signed)
CSN: [id number]     Arrival date & time 08/09/14  [id number] History   First MD Initiated Contact with Patient 08/09/14 1024     Chief Complaint  Patient presents with  . Cough  . Sore Throat  . Vaginal Bleeding     (Consider location/radiation/quality/duration/timing/severity/associated sxs/prior Treatment) HPI Comments: Patient presents emergency department with chief complaints of sore throat and cough. She states that she has had the cough for the past 2 weeks. She is concerned that she is developing bronchitis or pneumonia. She reports associated sore throat. She denies any fevers chills. Denies any nausea, vomiting, or diarrhea. Additionally, she states that she has had some vaginal spotting for the past several months. This is been ongoing since having her IUD removed. She states that she wants to have a pregnancy test done to be certain. She has tried having a pregnancy tests from the [employer] which is been negative.  The history is provided by the patient. No language interpreter was used.    Past Medical History  Diagnosis Date  . Depression   . Migraines   . Anxiety   . ADHD (attention deficit hyperactivity disorder)   . Anemia   . Asthma    Past Surgical History  Procedure Laterality Date  . Dilation and curettage of uterus      x 2  . Cholecystectomy     Family History  Problem Relation Age of Onset  . Migraines Mother   . Alopecia Mother   . Sickle cell trait Mother   . Cancer Father     died of lung cancer  . Sickle cell anemia Sister   . Seizures Brother    History  Substance Use Topics  . Smoking status: Current Every Day Smoker -- 0.10 packs/day    Types: Cigarettes    Start date: 10/21/2011  . Smokeless tobacco: Never Used  . Alcohol Use: No   OB History    Gravida Para Term Preterm AB TAB SAB Ectopic Multiple Living   6 3 2 1 3  3   3      Review of Systems  Constitutional: Negative for fever and chills.  Respiratory: Positive for cough.  Negative for shortness of breath.   Cardiovascular: Negative for chest pain.  Gastrointestinal: Negative for nausea, vomiting, diarrhea and constipation.  Genitourinary: Positive for vaginal bleeding. Negative for dysuria.  All other systems reviewed and are negative.     Allergies  Acetaminophen  Home Medications   Prior to Admission medications   Medication Sig Start Date End Date Taking? Authorizing Provider  albuterol (PROVENTIL HFA;VENTOLIN HFA) 108 (90 BASE) MCG/ACT inhaler Inhale 2 puffs into the lungs every 6 (six) hours as needed for wheezing or shortness of breath. Shortness of breath   Yes Historical Provider, MD  aspirin-acetaminophen-caffeine (EXCEDRIN MIGRAINE) [phone] MG per tablet Take 1 tablet by mouth every 6 (six) hours as needed for headache.   Yes Historical Provider, MD  cetirizine (ZYRTEC) 10 MG tablet Take 10 mg by mouth daily.   Yes Historical Provider, MD  norgestrel-ethinyl estradiol (LO/OVRAL,CRYSELLE) 0.3-30 MG-MCG tablet Take 1 tablet by mouth daily. Patient not taking: Reported on 08/09/2014 04/19/14   Allie BossierMyra C Dove, MD   BP 128/83 mmHg  Pulse 58  Temp(Src) 98.6 F (37 C) (Oral)  Resp 18  Ht 5\' 3"  (1.6 m)  Wt 105 lb (47.628 kg)  BMI 18.60 kg/m2  SpO2 100%  LMP 07/09/2014 Physical Exam  Constitutional: She is oriented to person, place,  and time. She appears well-developed and well-nourished.  HENT:  Head: Normocephalic and atraumatic.  Oropharynx mildly erythematous, no tonsillar exudate, no abscess, airway intact  Eyes: Conjunctivae and EOM are normal. Pupils are equal, round, and reactive to light.  Neck: Normal range of motion. Neck supple.  Cardiovascular: Normal rate and regular rhythm.  Exam reveals no gallop and no friction rub.   No murmur heard. Pulmonary/Chest: Effort normal and breath sounds normal. No respiratory distress. She has no wheezes. She has no rales. She exhibits no tenderness.  Abdominal: Soft. Bowel sounds are normal. She  exhibits no distension and no mass. There is no tenderness. There is no rebound and no guarding.  Genitourinary:  Pelvic deferred  Musculoskeletal: Normal range of motion. She exhibits no edema or tenderness.  Neurological: She is alert and oriented to person, place, and time.  Skin: Skin is warm and dry.  Psychiatric: She has a normal mood and affect. Her behavior is normal. Judgment and thought content normal.  Nursing note and vitals reviewed.   ED Course  Procedures (including critical care time) Results for orders placed or performed during the hospital encounter of 08/09/14  Urinalysis, Routine w reflex microscopic  Result Value Ref Range   Color, Urine YELLOW YELLOW   APPearance CLEAR CLEAR   Specific Gravity, Urine 1.007 1.005 - 1.030   pH 7.0 5.0 - 8.0   Glucose, UA NEGATIVE NEGATIVE mg/dL   Hgb urine dipstick LARGE (A) NEGATIVE   Bilirubin Urine NEGATIVE NEGATIVE   Ketones, ur NEGATIVE NEGATIVE mg/dL   Protein, ur NEGATIVE NEGATIVE mg/dL   Urobilinogen, UA 0.2 0.0 - 1.0 mg/dL   Nitrite NEGATIVE NEGATIVE   Leukocytes, UA NEGATIVE NEGATIVE  Urine microscopic-add on  Result Value Ref Range   Squamous Epithelial / LPF FEW (A) RARE   WBC, UA 0-2 <3 WBC/hpf   RBC / HPF 7-10 <3 RBC/hpf   Bacteria, UA FEW (A) RARE  POC Urine Pregnancy, ED (do NOT order at [facility])  Result Value Ref Range   Preg Test, Ur NEGATIVE NEGATIVE   Dg Chest 2 View  08/09/2014   CLINICAL DATA:  Productive cough for 2-3 weeks, initial encounter  EXAM: CHEST  2 VIEW  COMPARISON:  08/11/2013  FINDINGS: The heart size and mediastinal contours are within normal limits. Both lungs are clear. The visualized skeletal structures are unremarkable.  IMPRESSION: No active cardiopulmonary disease.   Electronically Signed   By: Alcide CleverMark  Lukens M.D.   On: 08/09/2014 11:34     Imaging Review Dg Chest 2 View  08/09/2014   CLINICAL DATA:  Productive cough for 2-3 weeks, initial encounter  EXAM: CHEST  2 VIEW   COMPARISON:  08/11/2013  FINDINGS: The heart size and mediastinal contours are within normal limits. Both lungs are clear. The visualized skeletal structures are unremarkable.  IMPRESSION: No active cardiopulmonary disease.   Electronically Signed   By: Alcide CleverMark  Lukens M.D.   On: 08/09/2014 11:34     EKG Interpretation None      MDM   Final diagnoses:  Cough  Irregular uterine bleeding  Sore throat    Patient with cough and sore throat. Will check chest x-ray. No fever. Encouraged OB/GYN follow-up for vaginal spotting times several months. No pelvic pain or lower abdominal pain. No vaginal discharge. Patient defers pelvic exam this time, and states she will follow-up with her OB/GYN.  Pt CXR negative for acute infiltrate. Patients symptoms are consistent with URI, likely viral etiology. Discussed that antibiotics  are not indicated for viral infections. Pt will be discharged with symptomatic treatment.  Verbalizes understanding and is agreeable with plan. Pt is hemodynamically stable & in NAD prior to dc.    Roxy Horseman, PA-C 08/09/14 1159  Benny Lennert, MD 08/10/14 [phone]

## 2014-08-09 NOTE — Discharge Instructions (Signed)
Upper Respiratory Infection, Adult °An upper respiratory infection (URI) is also sometimes known as the common cold. The upper respiratory tract includes the nose, sinuses, throat, trachea, and bronchi. Bronchi are the airways leading to the lungs. Most people improve within 1 week, but symptoms can last up to 2 weeks. A residual cough may last even longer.  °CAUSES °Many different viruses can infect the tissues lining the upper respiratory tract. The tissues become irritated and inflamed and often become very moist. Mucus production is also common. A cold is contagious. You can easily spread the virus to others by oral contact. This includes kissing, sharing a glass, coughing, or sneezing. Touching your mouth or nose and then touching a surface, which is then touched by another person, can also spread the virus. °SYMPTOMS  °Symptoms typically develop 1 to 3 days after you come in contact with a cold virus. Symptoms vary from person to person. They may include: °· Runny nose. °· Sneezing. °· Nasal congestion. °· Sinus irritation. °· Sore throat. °· Loss of voice (laryngitis). °· Cough. °· Fatigue. °· Muscle aches. °· Loss of appetite. °· Headache. °· Low-grade fever. °DIAGNOSIS  °You might diagnose your own cold based on familiar symptoms, since most people get a cold 2 to 3 times a year. Your caregiver can confirm this based on your exam. Most importantly, your caregiver can check that your symptoms are not due to another disease such as strep throat, sinusitis, pneumonia, asthma, or epiglottitis. Blood tests, throat tests, and X-rays are not necessary to diagnose a common cold, but they may sometimes be helpful in excluding other more serious diseases. Your caregiver will decide if any further tests are required. °RISKS AND COMPLICATIONS  °You may be at risk for a more severe case of the common cold if you smoke cigarettes, have chronic heart disease (such as heart failure) or lung disease (such as asthma), or if  you have a weakened immune system. The very young and very old are also at risk for more serious infections. Bacterial sinusitis, middle ear infections, and bacterial pneumonia can complicate the common cold. The common cold can worsen asthma and chronic obstructive pulmonary disease (COPD). Sometimes, these complications can require emergency medical care and may be life-threatening. °PREVENTION  °The best way to protect against getting a cold is to practice good hygiene. Avoid oral or hand contact with people with cold symptoms. Wash your hands often if contact occurs. There is no clear evidence that vitamin C, vitamin E, echinacea, or exercise reduces the chance of developing a cold. However, it is always recommended to get plenty of rest and practice good nutrition. °TREATMENT  °Treatment is directed at relieving symptoms. There is no cure. Antibiotics are not effective, because the infection is caused by a virus, not by bacteria. Treatment may include: °· Increased fluid intake. Sports drinks offer valuable electrolytes, sugars, and fluids. °· Breathing heated mist or steam (vaporizer or shower). °· Eating chicken soup or other clear broths, and maintaining good nutrition. °· Getting plenty of rest. °· Using gargles or lozenges for comfort. °· Controlling fevers with ibuprofen or acetaminophen as directed by your caregiver. °· Increasing usage of your inhaler if you have asthma. °Zinc gel and zinc lozenges, taken in the first 24 hours of the common cold, can shorten the duration and lessen the severity of symptoms. Pain medicines may help with fever, muscle aches, and throat pain. A variety of non-prescription medicines are available to treat congestion and runny nose. Your caregiver   can make recommendations and may suggest nasal or lung inhalers for other symptoms.  HOME CARE INSTRUCTIONS   Only take over-the-counter or prescription medicines for pain, discomfort, or fever as directed by your  caregiver.  Use a warm mist humidifier or inhale steam from a shower to increase air moisture. This may keep secretions moist and make it easier to breathe.  Drink enough water and fluids to keep your urine clear or pale yellow.  Rest as needed.  Return to work when your temperature has returned to normal or as your caregiver advises. You may need to stay home longer to avoid infecting others. You can also use a face mask and careful hand washing to prevent spread of the virus. SEEK MEDICAL CARE IF:   After the first few days, you feel you are getting worse rather than better.  You need your caregiver's advice about medicines to control symptoms.  You develop chills, worsening shortness of breath, or brown or red sputum. These may be signs of pneumonia.  You develop yellow or brown nasal discharge or pain in the face, especially when you bend forward. These may be signs of sinusitis.  You develop a fever, swollen neck glands, pain with swallowing, or white areas in the back of your throat. These may be signs of strep throat. SEEK IMMEDIATE MEDICAL CARE IF:   You have a fever.  You develop severe or persistent headache, ear pain, sinus pain, or chest pain.  You develop wheezing, a prolonged cough, cough up blood, or have a change in your usual mucus (if you have chronic lung disease).  You develop sore muscles or a stiff neck. Document Released: 02/05/2001 Document Revised: 11/04/2011 Document Reviewed: 11/17/2013 [facility] Hospital[facility] Patient Information 2015 [employer], [place]. This information is not intended to replace advice given to you by your health care provider. Make sure you discuss any questions you have with your health care provider.   Abnormal Uterine Bleeding Abnormal uterine bleeding means bleeding from the vagina that is not your normal menstrual period. This can be:  Bleeding or spotting between periods.  Bleeding after sex (sexual intercourse).  Bleeding that is heavier or  more than normal.  Periods that last longer than usual.  Bleeding after menopause. There are many problems that may cause this. Treatment will depend on the cause of the bleeding. Any kind of bleeding that is not normal should be reviewed by your doctor.  HOME CARE Watch your condition for any changes. These actions may lessen any discomfort you are having:  Do not use tampons or douches as told by your doctor.  Change your pads often. You should get regular pelvic exams and Pap tests. Keep all appointments for tests as told by your doctor. GET HELP IF:  You are bleeding for more than 1 week.  You feel dizzy at times. GET HELP RIGHT AWAY IF:   You pass out.  You have to change pads every 15 to 30 minutes.  You have belly pain.  You have a fever.  You become sweaty or weak.  You are passing large blood clots from the vagina.  You feel sick to your stomach (nauseous) and throw up (vomit). MAKE SURE YOU:  Understand these instructions.  Will watch your condition.  Will get help right away if you are not doing well or get worse. Document Released: 06/09/2009 Document Revised: 08/17/2013 Document Reviewed: 03/11/2013 [facility] Patient Information 2015 [employer], [place]. This information is not intended to replace advice given to you by  your health care provider. Make sure you discuss any questions you have with your health care provider.

## 2014-08-09 NOTE — ED Notes (Signed)
Family at bedside. 

## 2014-09-10 ENCOUNTER — Emergency Department (HOSPITAL_COMMUNITY): Payer: Medicaid Other

## 2014-09-10 ENCOUNTER — Encounter (HOSPITAL_COMMUNITY): Payer: Self-pay | Admitting: Emergency Medicine

## 2014-09-10 ENCOUNTER — Emergency Department (HOSPITAL_COMMUNITY)
Admission: EM | Admit: 2014-09-10 | Discharge: 2014-09-10 | Disposition: A | Discharge status: Home / Self Care | Payer: Medicaid Other | Attending: Emergency Medicine | Admitting: Emergency Medicine

## 2014-09-10 DIAGNOSIS — M545 Low back pain: Secondary | ICD-10-CM | POA: Diagnosis not present

## 2014-09-10 DIAGNOSIS — Z8679 Personal history of other diseases of the circulatory system: Secondary | ICD-10-CM | POA: Insufficient documentation

## 2014-09-10 DIAGNOSIS — H9209 Otalgia, unspecified ear: Secondary | ICD-10-CM | POA: Diagnosis not present

## 2014-09-10 DIAGNOSIS — J029 Acute pharyngitis, unspecified: Secondary | ICD-10-CM | POA: Diagnosis present

## 2014-09-10 DIAGNOSIS — R51 Headache: Secondary | ICD-10-CM | POA: Diagnosis not present

## 2014-09-10 DIAGNOSIS — J069 Acute upper respiratory infection, unspecified: Secondary | ICD-10-CM | POA: Insufficient documentation

## 2014-09-10 DIAGNOSIS — Z7982 Long term (current) use of aspirin: Secondary | ICD-10-CM | POA: Diagnosis not present

## 2014-09-10 DIAGNOSIS — Z3202 Encounter for pregnancy test, result negative: Secondary | ICD-10-CM | POA: Insufficient documentation

## 2014-09-10 DIAGNOSIS — J45901 Unspecified asthma with (acute) exacerbation: Secondary | ICD-10-CM | POA: Insufficient documentation

## 2014-09-10 DIAGNOSIS — Z862 Personal history of diseases of the blood and blood-forming organs and certain disorders involving the immune mechanism: Secondary | ICD-10-CM | POA: Insufficient documentation

## 2014-09-10 DIAGNOSIS — Z720 Tobacco use: Secondary | ICD-10-CM | POA: Diagnosis not present

## 2014-09-10 DIAGNOSIS — Z8659 Personal history of other mental and behavioral disorders: Secondary | ICD-10-CM | POA: Insufficient documentation

## 2014-09-10 LAB — URINE MICROSCOPIC-ADD ON

## 2014-09-10 LAB — URINALYSIS, ROUTINE W REFLEX MICROSCOPIC
GLUCOSE, UA: NEGATIVE mg/dL
HGB URINE DIPSTICK: NEGATIVE
Ketones, ur: 40 mg/dL — AB
Leukocytes, UA: NEGATIVE
NITRITE: NEGATIVE
Protein, ur: 30 mg/dL — AB
Specific Gravity, Urine: 1.024 (ref 1.005–1.030)
UROBILINOGEN UA: 1 mg/dL (ref 0.0–1.0)
pH: 6 (ref 5.0–8.0)

## 2014-09-10 LAB — PREGNANCY, URINE: PREG TEST UR: NEGATIVE

## 2014-09-10 LAB — RAPID STREP SCREEN (MED CTR MEBANE ONLY): STREPTOCOCCUS, GROUP A SCREEN (DIRECT): NEGATIVE

## 2014-09-10 MED ORDER — MAGIC MOUTHWASH W/LIDOCAINE: 5.0000 mL | Freq: Three times a day (TID) | ORAL | Status: DC | PRN

## 2014-09-10 MED ORDER — KETOROLAC TROMETHAMINE 60 MG/2ML IM SOLN
60.0000 mg | Freq: Once | INTRAMUSCULAR | Status: AC
  Administered 2014-09-10: 60 mg via INTRAMUSCULAR
  Filled 2014-09-10: qty 2

## 2014-09-10 NOTE — Discharge Instructions (Signed)
Upper Respiratory Infection, Adult °An upper respiratory infection (URI) is also sometimes known as the common cold. The upper respiratory tract includes the nose, sinuses, throat, trachea, and bronchi. Bronchi are the airways leading to the lungs. Most people improve within 1 week, but symptoms can last up to 2 weeks. A residual cough may last even longer.  °CAUSES °Many different viruses can infect the tissues lining the upper respiratory tract. The tissues become irritated and inflamed and often become very moist. Mucus production is also common. A cold is contagious. You can easily spread the virus to others by oral contact. This includes kissing, sharing a glass, coughing, or sneezing. Touching your mouth or nose and then touching a surface, which is then touched by another person, can also spread the virus. °SYMPTOMS  °Symptoms typically develop 1 to 3 days after you come in contact with a cold virus. Symptoms vary from person to person. They may include: °· Runny nose. °· Sneezing. °· Nasal congestion. °· Sinus irritation. °· Sore throat. °· Loss of voice (laryngitis). °· Cough. °· Fatigue. °· Muscle aches. °· Loss of appetite. °· Headache. °· Low-grade fever. °DIAGNOSIS  °You might diagnose your own cold based on familiar symptoms, since most people get a cold 2 to 3 times a year. Your caregiver can confirm this based on your exam. Most importantly, your caregiver can check that your symptoms are not due to another disease such as strep throat, sinusitis, pneumonia, asthma, or epiglottitis. Blood tests, throat tests, and X-rays are not necessary to diagnose a common cold, but they may sometimes be helpful in excluding other more serious diseases. Your caregiver will decide if any further tests are required. °RISKS AND COMPLICATIONS  °You may be at risk for a more severe case of the common cold if you smoke cigarettes, have chronic heart disease (such as heart failure) or lung disease (such as asthma), or if  you have a weakened immune system. The very young and very old are also at risk for more serious infections. Bacterial sinusitis, middle ear infections, and bacterial pneumonia can complicate the common cold. The common cold can worsen asthma and chronic obstructive pulmonary disease (COPD). Sometimes, these complications can require emergency medical care and may be life-threatening. °PREVENTION  °The best way to protect against getting a cold is to practice good hygiene. Avoid oral or hand contact with people with cold symptoms. Wash your hands often if contact occurs. There is no clear evidence that vitamin C, vitamin E, echinacea, or exercise reduces the chance of developing a cold. However, it is always recommended to get plenty of rest and practice good nutrition. °TREATMENT  °Treatment is directed at relieving symptoms. There is no cure. Antibiotics are not effective, because the infection is caused by a virus, not by bacteria. Treatment may include: °· Increased fluid intake. Sports drinks offer valuable electrolytes, sugars, and fluids. °· Breathing heated mist or steam (vaporizer or shower). °· Eating chicken soup or other clear broths, and maintaining good nutrition. °· Getting plenty of rest. °· Using gargles or lozenges for comfort. °· Controlling fevers with ibuprofen or acetaminophen as directed by your caregiver. °· Increasing usage of your inhaler if you have asthma. °Zinc gel and zinc lozenges, taken in the first 24 hours of the common cold, can shorten the duration and lessen the severity of symptoms. Pain medicines may help with fever, muscle aches, and throat pain. A variety of non-prescription medicines are available to treat congestion and runny nose. Your caregiver   can make recommendations and may suggest nasal or lung inhalers for other symptoms.  °HOME CARE INSTRUCTIONS  °· Only take over-the-counter or prescription medicines for pain, discomfort, or fever as directed by your  caregiver. °· Use a warm mist humidifier or inhale steam from a shower to increase air moisture. This may keep secretions moist and make it easier to breathe. °· Drink enough water and fluids to keep your urine clear or pale yellow. °· Rest as needed. °· Return to work when your temperature has returned to normal or as your caregiver advises. You may need to stay home longer to avoid infecting others. You can also use a face mask and careful hand washing to prevent spread of the virus. °SEEK MEDICAL CARE IF:  °· After the first few days, you feel you are getting worse rather than better. °· You need your caregiver's advice about medicines to control symptoms. °· You develop chills, worsening shortness of breath, or brown or red sputum. These may be signs of pneumonia. °· You develop yellow or brown nasal discharge or pain in the face, especially when you bend forward. These may be signs of sinusitis. °· You develop a fever, swollen neck glands, pain with swallowing, or white areas in the back of your throat. These may be signs of strep throat. °SEEK IMMEDIATE MEDICAL CARE IF:  °· You have a fever. °· You develop severe or persistent headache, ear pain, sinus pain, or chest pain. °· You develop wheezing, a prolonged cough, cough up blood, or have a change in your usual mucus (if you have chronic lung disease). °· You develop sore muscles or a stiff neck. °Document Released: 02/05/2001 Document Revised: 11/04/2011 Document Reviewed: 11/17/2013 °[employer]® Patient Information ©2015 [employer]. This information is not intended to replace advice given to you by your health care provider. Make sure you discuss any questions you have with your health care provider. ° ° °Pharyngitis °Pharyngitis is redness, pain, and swelling (inflammation) of your pharynx.  °CAUSES  °Pharyngitis is usually caused by infection. Most of the time, these infections are from viruses (viral) and are part of a cold. However, sometimes  pharyngitis is caused by bacteria (bacterial). Pharyngitis can also be caused by allergies. Viral pharyngitis may be spread from person to person by coughing, sneezing, and personal items or utensils (cups, forks, spoons, toothbrushes). Bacterial pharyngitis may be spread from person to person by more intimate contact, such as kissing.  °SIGNS AND SYMPTOMS  °Symptoms of pharyngitis include:   °· Sore throat.   °· Tiredness (fatigue).   °· Low-grade fever.   °· Headache. °· Joint pain and muscle aches. °· Skin rashes. °· Swollen lymph nodes. °· Plaque-like film on throat or tonsils (often seen with bacterial pharyngitis). °DIAGNOSIS  °Your health care provider will ask you questions about your illness and your symptoms. Your medical history, along with a physical exam, is often all that is needed to diagnose pharyngitis. Sometimes, a rapid strep test is done. Other lab tests may also be done, depending on the suspected cause.  °TREATMENT  °Viral pharyngitis will usually get better in 3-4 days without the use of medicine. Bacterial pharyngitis is treated with medicines that kill germs (antibiotics).  °HOME CARE INSTRUCTIONS  °· Drink enough water and fluids to keep your urine clear or pale yellow.   °· Only take over-the-counter or prescription medicines as directed by your health care provider:   °¨ If you are prescribed antibiotics, make sure you finish them even if you start to feel better.   °¨   Do not take aspirin.   °· Get lots of rest.   °· Gargle with 8 oz of salt water (½ tsp of salt per 1 qt of water) as often as every 1-2 hours to soothe your throat.   °· Throat lozenges (if you are not at risk for choking) or sprays may be used to soothe your throat. °SEEK MEDICAL CARE IF:  °· You have large, tender lumps in your neck. °· You have a rash. °· You cough up green, yellow-brown, or bloody spit. °SEEK IMMEDIATE MEDICAL CARE IF:  °· Your neck becomes stiff. °· You drool or are unable to swallow liquids. °· You  vomit or are unable to keep medicines or liquids down. °· You have severe pain that does not go away with the use of recommended medicines. °· You have trouble breathing (not caused by a stuffy nose). °MAKE SURE YOU:  °· Understand these instructions. °· Will watch your condition. °· Will get help right away if you are not doing well or get worse. °Document Released: 08/12/2005 Document Revised: 06/02/2013 Document Reviewed: 04/19/2013 °ExitCare® Patient Information ©2015 [employer]. This information is not intended to replace advice given to you by your health care provider. Make sure you discuss any questions you have with your health care provider. ° °

## 2014-09-10 NOTE — ED Notes (Addendum)
Pt reports Thursday felt like she had a fever around 1200. Later that day she began to feel pain in her throat and B/L ears. At end of triage pt reports left sided flank pain and increase urination.

## 2014-09-10 NOTE — ED Provider Notes (Signed)
CSN: [id number]     Arrival date & time 09/10/14  1741 History   First MD Initiated Contact with Patient 09/10/14 1843     Chief Complaint  Patient presents with  . Sore Throat  . Flank Pain     (Consider location/radiation/quality/duration/timing/severity/associated sxs/prior Treatment) HPI  34 year old female presents with sore throat for the past 3 days. She has burning when she swallows, congestion, and bilateral ear pain. She's developed a cough today. Is able to swallow but states is very painful. Has been trying multiple different cough drops as well as ibuprofen without relief. The patient feels like her asthma is flaring today and she's been using her albuterol multiple times today. Has been having fevers over these 3 days as well. Is concerned that she has strep. She also endorses left lower back pain that is been going on for the past couple days since this started. She does endorse urinary frequency but denies hematuria or dysuria. The frequency is been going on for the past couple months.  Past Medical History  Diagnosis Date  . Depression   . Migraines   . Anxiety   . ADHD (attention deficit hyperactivity disorder)   . Anemia   . Asthma    Past Surgical History  Procedure Laterality Date  . Dilation and curettage of uterus      x 2  . Cholecystectomy     Family History  Problem Relation Age of Onset  . Migraines Mother   . Alopecia Mother   . Sickle cell trait Mother   . Cancer Father     died of lung cancer  . Sickle cell anemia Sister   . Seizures Brother    History  Substance Use Topics  . Smoking status: Current Every Day Smoker -- 0.10 packs/day    Types: Cigarettes    Start date: 10/21/2011  . Smokeless tobacco: Never Used  . Alcohol Use: No   OB History    Gravida Para Term Preterm AB TAB SAB Ectopic Multiple Living   6 3 2 1 3  3   3      Review of Systems  Constitutional: Positive for fever and chills.  HENT: Positive for congestion, ear  pain and sore throat. Negative for voice change.   Respiratory: Positive for cough, shortness of breath and wheezing.   Cardiovascular: Negative for chest pain.  Gastrointestinal: Negative for vomiting and abdominal pain.  Genitourinary: Positive for frequency. Negative for dysuria.  Musculoskeletal: Positive for back pain.  Neurological: Positive for headaches.  All other systems reviewed and are negative.     Allergies  Acetaminophen  Home Medications   Prior to Admission medications   Medication Sig Start Date End Date Taking? Authorizing Provider  albuterol (PROVENTIL HFA;VENTOLIN HFA) 108 (90 BASE) MCG/ACT inhaler Inhale 2 puffs into the lungs every 6 (six) hours as needed for wheezing or shortness of breath. Shortness of breath    Historical Provider, MD  aspirin-acetaminophen-caffeine (EXCEDRIN MIGRAINE) [phone] MG per tablet Take 1 tablet by mouth every 6 (six) hours as needed for headache.    Historical Provider, MD  cetirizine (ZYRTEC) 10 MG tablet Take 10 mg by mouth daily.    Historical Provider, MD  guaiFENesin (ROBITUSSIN) 100 MG/5ML liquid Take 5-10 mLs (100-200 mg total) by mouth every 4 (four) hours as needed for cough. 08/09/14   Roxy Horsemanobert Browning, PA-C  norgestrel-ethinyl estradiol (LO/OVRAL,CRYSELLE) 0.3-30 MG-MCG tablet Take 1 tablet by mouth daily. Patient not taking: Reported on 08/09/2014 04/19/14  Allie Bossier, MD   BP 121/77 mmHg  Pulse 71  Temp(Src) 98.4 F (36.9 C) (Oral)  Resp 18  Ht  (1.6 m)  Wt 110 lb (49.896 kg)  BMI 19.49 kg/m2  SpO2 97%  LMP 08/28/2014 Physical Exam  Constitutional: She is oriented to person, place, and time. She appears well-developed and well-nourished. No distress.  HENT:  Head: Normocephalic and atraumatic.  Right Ear: External ear normal.  Left Ear: External ear normal.  Nose: Nose normal.  Mouth/Throat: Uvula is midline and mucous membranes are normal. Mucous membranes are not dry. Oropharyngeal exudate and  posterior oropharyngeal erythema present. No posterior oropharyngeal edema or tonsillar abscesses.  1+ tonsils bilaterally  Eyes: Right eye exhibits no discharge. Left eye exhibits no discharge.  Cardiovascular: Normal rate, regular rhythm and normal heart sounds.   Pulmonary/Chest: Effort normal and breath sounds normal.  Abdominal: Soft. There is no tenderness.  Musculoskeletal:       Lumbar back: She exhibits no tenderness and no bony tenderness.  Neurological: She is alert and oriented to person, place, and time.  Skin: Skin is warm and dry. She is not diaphoretic.  Nursing note and vitals reviewed.   ED Course  Procedures (including critical care time) Labs Review Labs Reviewed  URINALYSIS, ROUTINE W REFLEX MICROSCOPIC - Abnormal; Notable for the following:    Color, Urine AMBER (*)    Bilirubin Urine SMALL (*)    Ketones, ur 40 (*)    Protein, ur 30 (*)    All other components within normal limits  URINE MICROSCOPIC-ADD ON - Abnormal; Notable for the following:    Bacteria, UA FEW (*)    All other components within normal limits  RAPID STREP SCREEN  CULTURE, GROUP A STREP  PREGNANCY, URINE  POC URINE PREG, ED    Imaging Review Dg Chest 2 View  09/10/2014   CLINICAL DATA:  Tonsillar swelling for 2 days, shortness of breath for 2 days, cough for 1 day, history asthma, smoking 08/09/2014  EXAM: CHEST  2 VIEW  COMPARISON:  None.  FINDINGS: Normal heart size, mediastinal contours, and pulmonary vascularity.  Mild chronic peribronchial thickening  Lungs otherwise clear.  No pleural effusion or pneumothorax.  No acute bony abnormalities.  RIGHT upper quadrant surgical clips consistent with cholecystectomy  IMPRESSION: Chronic peribronchial thickening which could reflect chronic bronchitis or asthma.  No acute infiltrate.   Electronically Signed   By: Ulyses Southward M.D.   On: 09/10/2014 20:18     EKG Interpretation None      MDM   Final diagnoses:  Upper respiratory infection     Patient symptoms are consistent with an upper respiratory infection. She has no concerning findings on exam such as tachycardia, hypoxia, increased work of breathing, or airway abnormalities. She appears well. Urine shows no signs of UTI. Likely viral pharyngitis given negative strep test. Will treat symptomatically, encourage hydration at home, and follow-up with PCP.    Audree Camel, MD 09/10/14 2121

## 2014-09-13 LAB — CULTURE, GROUP A STREP

## 2014-10-18 ENCOUNTER — Ambulatory Visit (INDEPENDENT_AMBULATORY_CARE_PROVIDER_SITE_OTHER): Payer: Medicaid Other | Admitting: Obstetrics & Gynecology

## 2014-10-18 ENCOUNTER — Encounter: Payer: Self-pay | Admitting: Obstetrics & Gynecology

## 2014-10-18 ENCOUNTER — Other Ambulatory Visit (HOSPITAL_COMMUNITY)
Admission: RE | Admit: 2014-10-18 | Discharge: 2014-10-18 | Disposition: A | Discharge status: Home / Self Care | Payer: Medicaid Other | Source: Ambulatory Visit | Attending: Obstetrics & Gynecology | Admitting: Obstetrics & Gynecology

## 2014-10-18 ENCOUNTER — Ambulatory Visit: Payer: Medicaid Other | Admitting: Obstetrics & Gynecology

## 2014-10-18 VITALS — BP 129/81 | HR 59 | Ht 63.0 in | Wt 108.0 lb

## 2014-10-18 DIAGNOSIS — N926 Irregular menstruation, unspecified: Secondary | ICD-10-CM

## 2014-10-18 DIAGNOSIS — A749 Chlamydial infection, unspecified: Secondary | ICD-10-CM

## 2014-10-18 DIAGNOSIS — Z1151 Encounter for screening for human papillomavirus (HPV): Secondary | ICD-10-CM | POA: Diagnosis present

## 2014-10-18 DIAGNOSIS — Z01419 Encounter for gynecological examination (general) (routine) without abnormal findings: Secondary | ICD-10-CM | POA: Diagnosis present

## 2014-10-18 DIAGNOSIS — Z113 Encounter for screening for infections with a predominantly sexual mode of transmission: Secondary | ICD-10-CM | POA: Insufficient documentation

## 2014-10-18 DIAGNOSIS — R87810 Cervical high risk human papillomavirus (HPV) DNA test positive: Secondary | ICD-10-CM | POA: Diagnosis present

## 2014-10-18 HISTORY — DX: Chlamydial infection, unspecified: A74.9

## 2014-10-18 LAB — POCT URINE PREGNANCY: PREG TEST UR: NEGATIVE

## 2014-10-18 NOTE — Addendum Note (Signed)
Addended by: Tandy GawHINTON, Dylon Correa C on: 10/18/2014 10:19 AM   Modules accepted: Orders

## 2014-10-18 NOTE — Addendum Note (Signed)
Addended by: Tandy GawHINTON, Eli Pattillo C on: 10/18/2014 10:12 AM   Modules accepted: Orders

## 2014-10-18 NOTE — Progress Notes (Signed)
   Subjective:    Patient ID: Kathryn Norman, female    DOB: 02/23/1981, 34 y.o.   MRN: [id number]  HPI  34 yo S AA P3 here because of having 2 periods in January. Her periods are generally at the end of the month, very light, lasting 3 days. She reports that her period this month (February) has been even lighter than usual. She has been trying to conceive with her boyfriend since 9/15.  Review of Systems     Objective:   Physical Exam [id number] Abd- benign Ambulating and breathing normally Affect normal  EG, vagina, cervix- normal NSSA, NT, mobile, normal adnexal exam      Assessment & Plan:  Desire for pregnancy- rec MVI daily to decrease risk of ONTD I explained that an average couple takes a year to conceive Irregular bleeding in January 2016- check CBC, TSH, cervical cultures. I suspect that this will resolve spontaneously Preventative care- Thin prep pap with cotesting

## 2014-10-19 LAB — CBC
HEMATOCRIT: 37.7 % (ref 36.0–46.0)
Hemoglobin: 12.5 g/dL (ref 12.0–15.0)
MCH: 29.7 pg (ref 26.0–34.0)
MCHC: 33.2 g/dL (ref 30.0–36.0)
MCV: 89.5 fL (ref 78.0–100.0)
MPV: 10.5 fL (ref 8.6–12.4)
Platelets: 224 10*3/uL (ref 150–400)
RBC: 4.21 MIL/uL (ref 3.87–5.11)
RDW: 14.2 % (ref 11.5–15.5)
WBC: 5.2 10*3/uL (ref 4.0–10.5)

## 2014-10-19 LAB — TSH: TSH: 0.556 u[IU]/mL (ref 0.350–4.500)

## 2014-10-24 LAB — CYTOLOGY - PAP

## 2014-11-11 ENCOUNTER — Encounter (HOSPITAL_COMMUNITY): Payer: Self-pay | Admitting: *Deleted

## 2014-11-11 ENCOUNTER — Inpatient Hospital Stay (HOSPITAL_COMMUNITY)
Admission: AD | Admit: 2014-11-11 | Discharge: 2014-11-11 | Disposition: A | Discharge status: Home / Self Care | Payer: Medicaid Other | Source: Ambulatory Visit | Attending: Obstetrics and Gynecology | Admitting: Obstetrics and Gynecology

## 2014-11-11 DIAGNOSIS — F17210 Nicotine dependence, cigarettes, uncomplicated: Secondary | ICD-10-CM | POA: Diagnosis not present

## 2014-11-11 DIAGNOSIS — R12 Heartburn: Secondary | ICD-10-CM | POA: Diagnosis not present

## 2014-11-11 DIAGNOSIS — N643 Galactorrhea not associated with childbirth: Secondary | ICD-10-CM | POA: Diagnosis not present

## 2014-11-11 LAB — POCT PREGNANCY, URINE: Preg Test, Ur: NEGATIVE

## 2014-11-11 MED ORDER — ONDANSETRON HCL 8 MG PO TABS: 8.0000 mg | ORAL_TABLET | Freq: Three times a day (TID) | ORAL | Status: DC | PRN

## 2014-11-11 MED ORDER — GI COCKTAIL ~~LOC~~
30.0000 mL | Freq: Once | ORAL | Status: AC
  Administered 2014-11-11: 30 mL via ORAL
  Filled 2014-11-11: qty 30

## 2014-11-11 MED ORDER — ONDANSETRON HCL 4 MG PO TABS
8.0000 mg | ORAL_TABLET | Freq: Once | ORAL | Status: AC
  Administered 2014-11-11: 8 mg via ORAL

## 2014-11-11 NOTE — MAU Note (Signed)
Breasts leaking milk for 2-3 wks. I have heartburn everyday. I have had my galbladder removed so i don't eat spicy foods. Throwing up some and spitting.

## 2014-11-11 NOTE — MAU Provider Note (Signed)
History     CSN: [id number]  Arrival date and time: 11/11/14 2139   None     Chief Complaint  Patient presents with  . Breast Discharge  . Heartburn  . Emesis   HPI  34 y.o. [name] presents to the MAU tonight stating that she has had bad heartburn for months now and she is nauseated and throws up sometimes in the morning. She also reports leaking clear and white discharge from both breasts for the past 2 weeks.  Past Medical History  Diagnosis Date  . Depression   . Migraines   . Anxiety   . ADHD (attention deficit hyperactivity disorder)   . Anemia   . Asthma     Past Surgical History  Procedure Laterality Date  . Dilation and curettage of uterus      x 2  . Cholecystectomy      Family History  Problem Relation Age of Onset  . Migraines Mother   . Alopecia Mother   . Sickle cell trait Mother   . Cancer Father     died of lung cancer  . Sickle cell anemia Sister   . Seizures Brother     History  Substance Use Topics  . Smoking status: Current Every Day Smoker -- 0.10 packs/day    Types: Cigarettes    Start date: 10/21/2011  . Smokeless tobacco: Never Used  . Alcohol Use: No    Allergies:  Allergies  Allergen Reactions  . Acetaminophen Nausea And Vomiting    Prescriptions prior to admission  Medication Sig Dispense Refill Last Dose  . albuterol (PROVENTIL HFA;VENTOLIN HFA) 108 (90 BASE) MCG/ACT inhaler Inhale 2 puffs into the lungs every 6 (six) hours as needed for wheezing or shortness of breath. Shortness of breath   11/10/2014 at Unknown time  . aspirin-acetaminophen-caffeine (EXCEDRIN MIGRAINE) 250-250-65 MG per tablet Take 1 tablet by mouth every 6 (six) hours as needed for headache.   Past Week at Unknown time  . cetirizine (ZYRTEC) 10 MG tablet Take 10 mg by mouth daily.   Past Week at Unknown time  . ibuprofen (ADVIL,MOTRIN) 200 MG tablet Take 800 mg by mouth every 6 (six) hours as needed for headache.   Past Week at Unknown time    Review  of Systems  Constitutional: Negative.   HENT: Negative.   Respiratory: Negative.   Cardiovascular: Negative.   Gastrointestinal: Positive for heartburn, nausea and vomiting.  Genitourinary: Negative.   Musculoskeletal: Negative.   Skin: Negative.   Neurological: Negative.   Endo/Heme/Allergies: Negative.   Psychiatric/Behavioral: Negative.    Physical Exam   Blood pressure 132/88, pulse 60, temperature 98.1 F (36.7 C), resp. rate 18, height 5\' 1"  (1.549 m), weight 48.626 kg (107 lb 3.2 oz), last menstrual period 10/29/2014.  Physical Exam  Nursing note and vitals reviewed. Constitutional: She is oriented to person, place, and time. She appears well-developed and well-nourished. No distress.  HENT:  Head: Normocephalic.  Neck: Normal range of motion.  Cardiovascular: Normal rate and regular rhythm.   Respiratory: Effort normal. No respiratory distress. Right breast exhibits nipple discharge. Left breast exhibits nipple discharge.  Very little clear discharge when pt squeezes nipples  GI: Soft.  Musculoskeletal: Normal range of motion.  Neurological: She is alert and oriented to person, place, and time.  Skin: Skin is warm and dry.  Psychiatric: She has a normal mood and affect. Her behavior is normal. Judgment and thought content normal.    MAU Course  Procedures  Prolactin level Gi Cocktail Zofran  Assessment and Plan  Heartburn Galactorhea  Follow up with Dr Marice Potter  Zofran ODT RX   Kathryn Norman 11/11/2014, 10:26 PM

## 2014-11-11 NOTE — Discharge Instructions (Signed)
Galactorrhea Galactorrhea is when there is a milky nipple discharge. It is different from normal milk in nursing mothers. It usually comes from both nipples. Galactorrhea is not a disease but may be a symptom of a problem. It may continue for years after weaning. Galactorrhea is caused by the hormone prolactin, which stimulates milk production. If the breast discharge looks like pus, is bloody or if there is a lump present in the affected breast, the discharge may be caused by other problems including:  A benign cyst.  Papilloma.  Breast cancer.  A breast infection.  A breast abscess. It can also be seen in men who have a low or absent female hormone (testosterone) level. Galactorrhea can be present in a newborn if the mother had high female hormone (estrogen) levels that crossed into the baby through the placenta. The baby usually has enlarged breasts, but in time, it all goes away on its own. CAUSES   Tumor of the pituitary gland in the brain.  Problems with the hypothalamus in the brain that stimulates the pituitary gland.  Low thyroid function (hypothyroid disease).  Chronic kidney failure.  Medications, antidepressants, tranquilizers and blood pressure medication.  Herbal medications (nettle, fennel, blessed thistle, anise and fenugreek seed).  Illegal drugs (marijuana and opiates).  Breast stimulation during sexual activity or too many and frequent self breast exams.  Birth control pills.  Surgery or trauma to the breast causing nerve damage.  Spinal cord injury. SYMPTOMS   White, yellow or green discharge from one or both breasts.  No menstrual period (amenorrhea) or infrequent menstrual periods (hypomenorrhea).  Hot flashes, lack of sexual desire or vaginal dryness.  Infertility in women and men.  Headaches and vision problems.  Decrease in calcium in your bones (developing osteopenia or osteoporosis). DIAGNOSIS  Your caregiver may be able to know your problem  by taking a detailed history and physical exam of you. Tests that may be done, include:  Blood tests to check for the prolactin hormone, your female and thyroid hormones and a pregnancy test.  A detailed eye exam.  Mammogram.  X-rays, CT scan or MRI of breasts or your brain looking for a tumor. TREATMENT   Stopping medications that may be causing the galactorrhea.  Treating low thyroid function with thyroid hormones.  Medical or surgical (if necessary) treatment of a pituitary gland tumor.  Medication to lower the prolactin hormone level when no cause can be found.  Surgery as a last resort to remove the breasts ducts if the discharge persists with treatment and is a problem.  Treatment may not be necessary if you are not bothered by the breast discharge. HOME CARE INSTRUCTIONS   Before seeing your caregiver, make a list of all your symptoms, medications, when the breast discharge started and questions you may have.  Avoid breast stimulation during sexual activity.  Perform breast self exam once a month.  Avoid clothes that rub on your nipples.  Use breasts pads to absorb the discharge.  Wear a support bra. SEEK MEDICAL CARE IF:   You have galactorrhea and you are trying to get pregnant.  You develop hot flashes, vaginal dryness or lack of sexual desire.  You stop having menstrual periods or they are irregular or far apart.  You have headaches.  You have vision problems. SEEK IMMEDIATE MEDICAL CARE IF:   Your breast discharge is bloody or pus-like.  You have breast pain.  You feel a lump in your breast.  Your breast shows wrinkling or  dimpling.  Your breast becomes red and swollen. Document Released: 09/19/2004 Document Revised: 11/04/2011 Document Reviewed: 08/02/2008 [facility] Patient Information 2015 [employer], [place]. This information is not intended to replace advice given to you by your health care provider. Make sure you discuss any questions you have  with your health care provider. Heartburn Heartburn is a painful, burning sensation in the chest. It may feel worse in certain positions, such as lying down or bending over. It is caused by stomach acid backing up into the tube that carries food from the mouth down to the stomach (lower esophagus).  CAUSES   Large meals.  Certain foods and drinks.  Exercise.  Increased acid production.  Being overweight or obese.  Certain medicines. SYMPTOMS   Burning pain in the chest or lower throat.  Bitter taste in the mouth.  Coughing. DIAGNOSIS  If the usual treatments for heartburn do not improve your symptoms, then tests may be done to see if there is another condition present. Possible tests may include:  X-rays.  Endoscopy. This is when a tube with a light and a camera on the end is used to examine the esophagus and the stomach.  A test to measure the amount of acid in the esophagus (pH test).  A test to see if the esophagus is working properly (esophageal manometry).  Blood, breath, or stool tests to check for bacteria that cause ulcers. TREATMENT   Your caregiver may tell you to use certain over-the-counter medicines (antacids, acid reducers) for mild heartburn.  Your caregiver may prescribe medicines to decrease the acid in your stomach or protect your stomach lining.  Your caregiver may recommend certain diet changes.  For severe cases, your caregiver may recommend that the head of your bed be elevated on blocks. (Sleeping with more pillows is not an effective treatment as it only changes the position of your head and does not improve the main problem of stomach acid refluxing into the esophagus.) HOME CARE INSTRUCTIONS   Take all medicines as directed by your caregiver.  Raise the head of your bed by putting blocks under the legs if instructed to by your caregiver.  Do not exercise right after eating.  Avoid eating 2 or 3 hours before bed. Do not lie down right after  eating.  Eat small meals throughout the day instead of 3 large meals.  Stop smoking if you smoke.  Maintain a healthy weight.  Identify foods and beverages that make your symptoms worse and avoid them. Foods you may want to avoid include:  Peppers.  Chocolate.  High-fat foods, including fried foods.  Spicy foods.  Garlic and onions.  Citrus fruits, including oranges, grapefruit, lemons, and limes.  Food containing tomatoes or tomato products.  Mint.  Carbonated drinks, caffeinated drinks, and alcohol.  Vinegar. SEEK IMMEDIATE MEDICAL CARE IF:  You have severe chest pain that goes down your arm or into your jaw or neck.  You feel sweaty, dizzy, or lightheaded.  You are short of breath.  You vomit blood.  You have difficulty or pain with swallowing.  You have bloody or black, tarry stools.  You have episodes of heartburn more than 3 times a week for more than 2 weeks. MAKE SURE YOU:  Understand these instructions.  Will watch your condition.  Will get help right away if you are not doing well or get worse. Document Released: 12/29/2008 Document Revised: 11/04/2011 Document Reviewed: 01/27/2011 [facility] Patient Information 2015 [place] LakeExitCare, [place]. This information is not intended to replace  advice given to you by your health care provider. Make sure you discuss any questions you have with your health care provider. ° °

## 2014-11-14 LAB — PROLACTIN: Prolactin: 12.2 ng/mL (ref 4.8–23.3)

## 2014-11-15 ENCOUNTER — Other Ambulatory Visit (INDEPENDENT_AMBULATORY_CARE_PROVIDER_SITE_OTHER): Payer: Medicaid Other | Admitting: *Deleted

## 2014-11-15 DIAGNOSIS — N643 Galactorrhea not associated with childbirth: Secondary | ICD-10-CM

## 2014-11-16 LAB — HCG, QUANTITATIVE, PREGNANCY: hCG, Beta Chain, Quant, S: <2 m[IU]/mL

## 2014-11-21 ENCOUNTER — Telehealth: Payer: Self-pay | Admitting: *Deleted

## 2014-11-21 MED ORDER — AZITHROMYCIN 500 MG PO TABS: 500.0000 mg | ORAL_TABLET | Freq: Every day | ORAL | Status: DC

## 2014-11-21 NOTE — Telephone Encounter (Signed)
-----   Message from Allie BossierMyra C Dove, MD sent at 11/15/2014  4:26 PM EDT ----- She is + CT and will need zithromax 1 gram po once. Boyfriend should be treated also. She will need a TOC in a month or so. Thanks

## 2014-12-16 ENCOUNTER — Other Ambulatory Visit: Payer: Self-pay | Admitting: Family Medicine

## 2014-12-16 MED ORDER — AZITHROMYCIN 500 MG PO TABS: 500.0000 mg | ORAL_TABLET | Freq: Every day | ORAL | Status: DC

## 2014-12-16 MED ORDER — AZITHROMYCIN 500 MG PO TABS: 1000.0000 mg | ORAL_TABLET | Freq: Once | ORAL | Status: DC

## 2014-12-20 ENCOUNTER — Ambulatory Visit: Payer: Medicaid Other | Admitting: Obstetrics & Gynecology

## 2014-12-27 ENCOUNTER — Ambulatory Visit: Payer: Medicaid Other | Admitting: Obstetrics & Gynecology

## 2015-12-06 IMAGING — CR DG CHEST 2V
2 series · 2 of 2 positions shown · non-contrast
Comparison: None.

CLINICAL DATA: Tonsillar swelling for 2 days, shortness of breath
for 2 days, cough for 1 day, history asthma, smoking 08/09/2014

EXAM:
CHEST  2 VIEW

[chest pa]
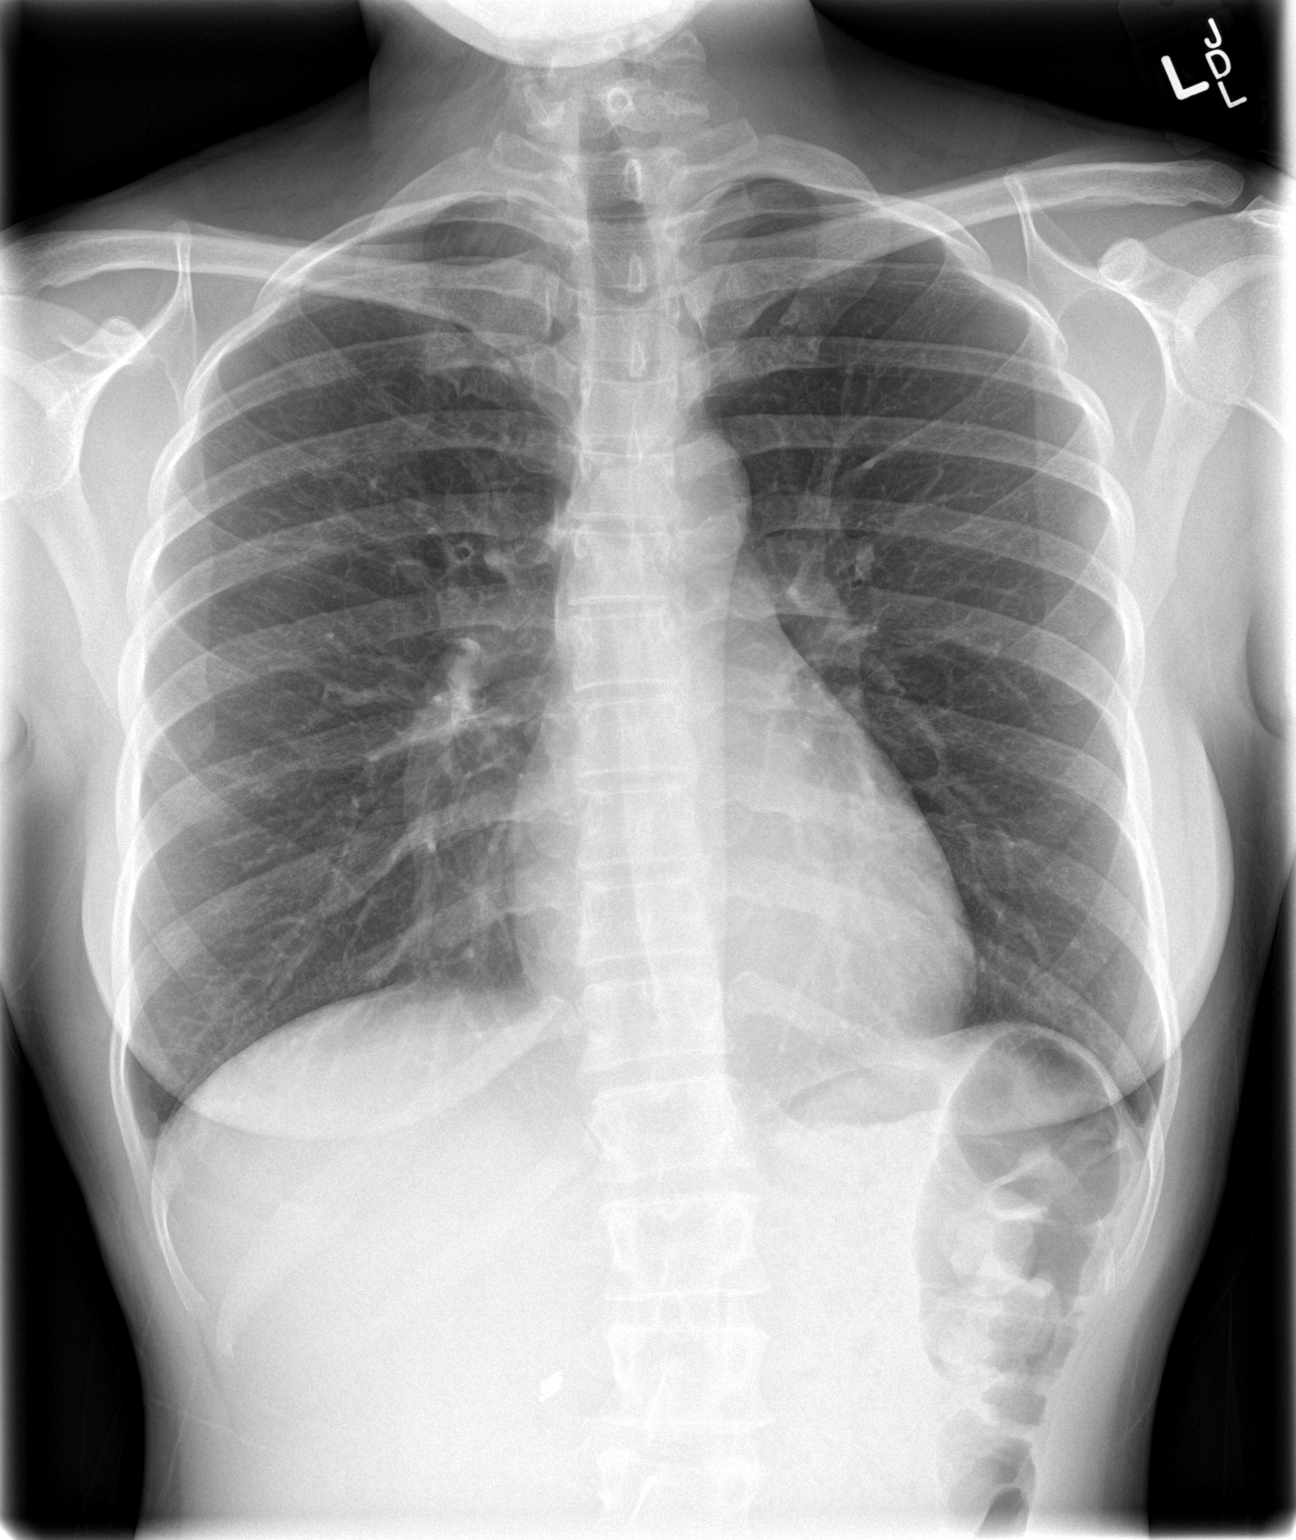

[chest lat]
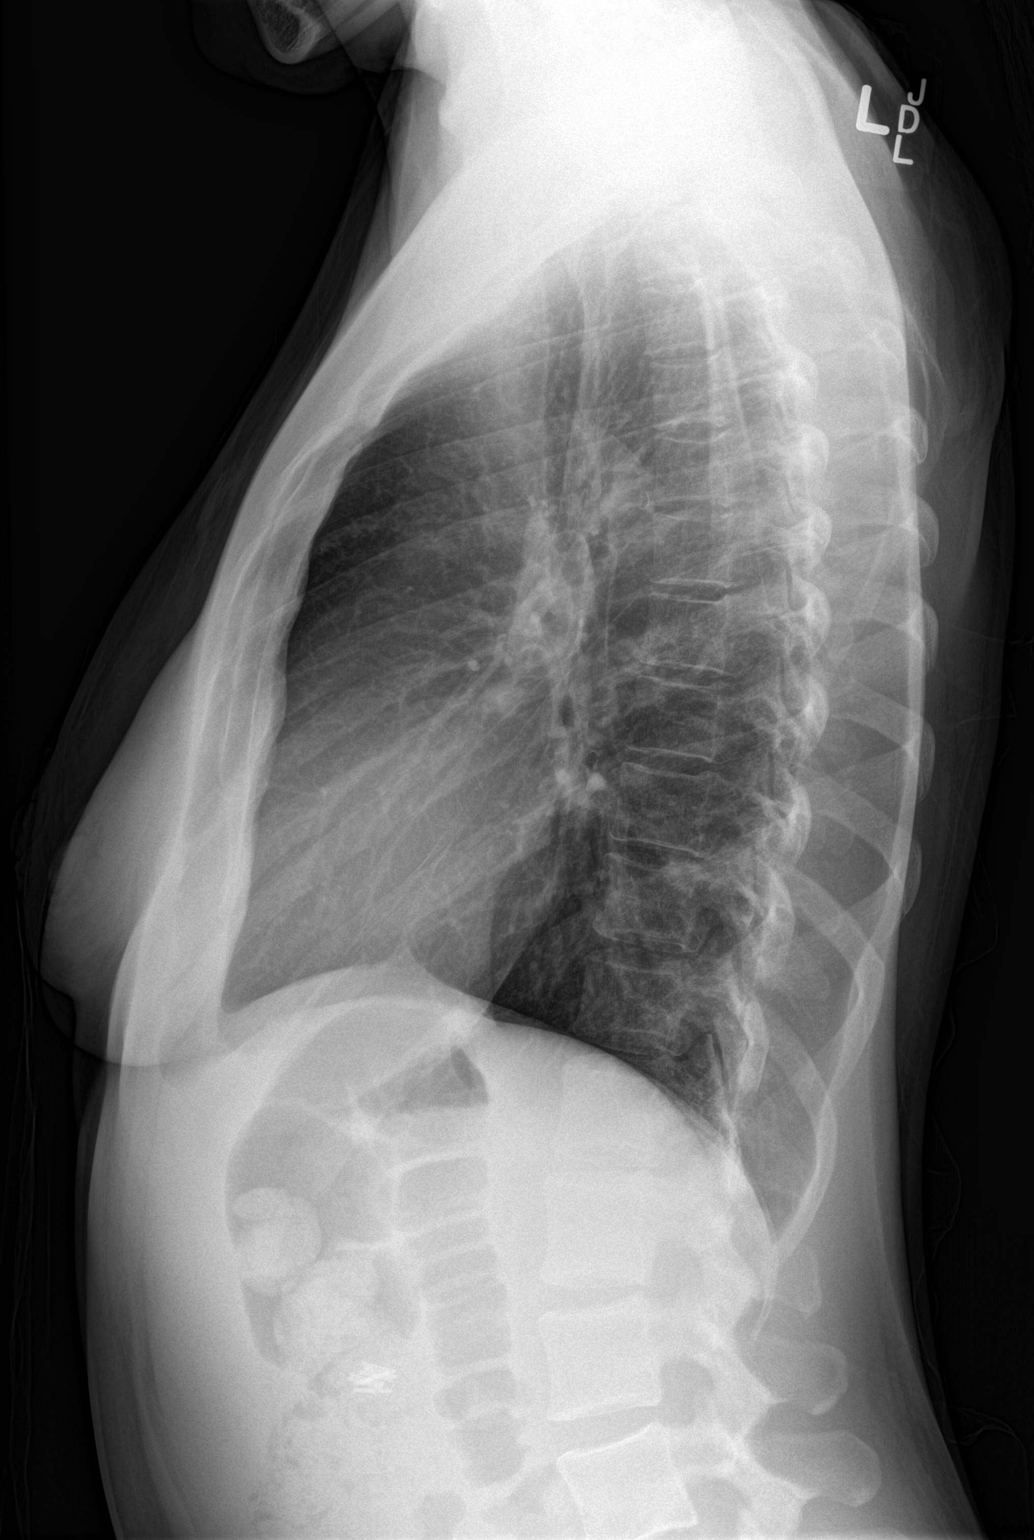

[2 of 2 positions shown; findings below may reference images not displayed]

FINDINGS: Normal heart size, mediastinal contours, and pulmonary vascularity.

Mild chronic peribronchial thickening

Lungs otherwise clear.

No pleural effusion or pneumothorax.

No acute bony abnormalities.

RIGHT upper quadrant surgical clips consistent with cholecystectomy
IMPRESSION: Chronic peribronchial thickening which could reflect chronic
bronchitis or asthma.

No acute infiltrate.

## 2016-04-03 ENCOUNTER — Ambulatory Visit: Payer: Medicaid Other | Admitting: Family Medicine

## 2016-04-16 ENCOUNTER — Ambulatory Visit: Payer: Medicaid Other | Admitting: Obstetrics & Gynecology

## 2018-02-17 ENCOUNTER — Emergency Department (HOSPITAL_COMMUNITY): Payer: Medicaid Other

## 2018-02-17 ENCOUNTER — Encounter (HOSPITAL_COMMUNITY): Payer: Self-pay

## 2018-02-17 ENCOUNTER — Emergency Department (HOSPITAL_COMMUNITY)
Admission: EM | Admit: 2018-02-17 | Discharge: 2018-02-17 | Disposition: A | Discharge status: Home / Self Care | Payer: Medicaid Other | Attending: Emergency Medicine | Admitting: Emergency Medicine

## 2018-02-17 DIAGNOSIS — J40 Bronchitis, not specified as acute or chronic: Secondary | ICD-10-CM

## 2018-02-17 DIAGNOSIS — J4531 Mild persistent asthma with (acute) exacerbation: Secondary | ICD-10-CM | POA: Diagnosis not present

## 2018-02-17 DIAGNOSIS — R0602 Shortness of breath: Secondary | ICD-10-CM | POA: Diagnosis present

## 2018-02-17 DIAGNOSIS — F17210 Nicotine dependence, cigarettes, uncomplicated: Secondary | ICD-10-CM | POA: Insufficient documentation

## 2018-02-17 DIAGNOSIS — R05 Cough: Secondary | ICD-10-CM | POA: Insufficient documentation

## 2018-02-17 LAB — POC URINE PREG, ED: Preg Test, Ur: NEGATIVE

## 2018-02-17 MED ORDER — BENZONATATE 100 MG PO CAPS: 100.0000 mg | ORAL_CAPSULE | Freq: Three times a day (TID) | ORAL | 0 refills | Status: DC

## 2018-02-17 MED ORDER — ALBUTEROL SULFATE HFA 108 (90 BASE) MCG/ACT IN AERS
2.0000 | INHALATION_SPRAY | RESPIRATORY_TRACT | Status: DC | PRN
  Administered 2018-02-17: 2 via RESPIRATORY_TRACT
  Filled 2018-02-17: qty 6.7

## 2018-02-17 MED ORDER — ALBUTEROL (5 MG/ML) CONTINUOUS INHALATION SOLN
10.0000 mg/h | INHALATION_SOLUTION | Freq: Once | RESPIRATORY_TRACT | Status: AC
  Administered 2018-02-17: 10 mg/h via RESPIRATORY_TRACT
  Filled 2018-02-17: qty 20

## 2018-02-17 MED ORDER — ALBUTEROL SULFATE (2.5 MG/3ML) 0.083% IN NEBU
5.0000 mg | INHALATION_SOLUTION | Freq: Once | RESPIRATORY_TRACT | Status: AC
  Administered 2018-02-17: 5 mg via RESPIRATORY_TRACT
  Filled 2018-02-17: qty 6

## 2018-02-17 MED ORDER — PREDNISONE 20 MG PO TABS: ORAL_TABLET | ORAL | 0 refills | Status: DC

## 2018-02-17 MED ORDER — PREDNISONE 20 MG PO TABS
60.0000 mg | ORAL_TABLET | Freq: Once | ORAL | Status: AC
  Administered 2018-02-17: 60 mg via ORAL
  Filled 2018-02-17: qty 3

## 2018-02-17 MED ORDER — IPRATROPIUM BROMIDE 0.02 % IN SOLN
0.5000 mg | Freq: Once | RESPIRATORY_TRACT | Status: AC
  Administered 2018-02-17: 0.5 mg via RESPIRATORY_TRACT
  Filled 2018-02-17: qty 2.5

## 2018-02-17 NOTE — Discharge Instructions (Addendum)
Your symptoms are likely asthma exacerbation due to underlying bronchitis.  Take prednisone as prescribed, take tessalon perles as needed for cough.  Use albuterol inhaler as needed but return to the ER if your condition worsen.

## 2018-02-17 NOTE — ED Notes (Signed)
Patient ambulated around nurses station at 95%-100% with no problems.

## 2018-02-17 NOTE — ED Triage Notes (Signed)
Patient reports having a productive cough with yellow/brown sputum and asthma flare up. Patient reports that she has been using her Albuterol inhaler with no relief.

## 2018-02-17 NOTE — ED Provider Notes (Signed)
[facility]-EMERGENCY DEPT Provider Note   CSN: [id number] Arrival date & time: 02/17/18  1003     History   Chief Complaint Chief Complaint  Patient presents with  . Asthma  . Cough    HPI Kathryn Norman is a 37 y.o. female.  HPI   37 year old female with history of asthma evaluation productive cough.  Patient report for the past week she has had cough productive with yellow phlegm, wheezing, shortness of breath, and feeling tired.  Symptom has become progressively worse despite using her inhaler on an hourly basis.  Her wheezing is persistent.  No report of fever, chills, headache, runny nose, sneezing, pleuritic chest pain or abdominal pain.  She does endorse some occasional posttussive emesis.  She attributed his symptoms due to weather changes and exposure to pollen.  She denies any prior complication with asthma requiring intubation or ICU stay.  She reports her a condition at home ran out so she has to keep her windows open which may have aggravated her asthma.  Past Medical History:  Diagnosis Date  . ADHD (attention deficit hyperactivity disorder)   . Anemia   . Anxiety   . Asthma   . Depression   . Migraines     Patient Active Problem List   Diagnosis Date Noted  . Heartburn 11/11/2014  . Galactorrhea 11/11/2014  . Malpositioned IUD 09/07/2012  . Hemorrhagic ovarian cyst 09/07/2012  . Anxiety and depression 06/19/2012  . Pap smear with mild cervical dysplasia 06/19/2012  . Chronic headaches 06/19/2012    Past Surgical History:  Procedure Laterality Date  . CHOLECYSTECTOMY    . DILATION AND CURETTAGE OF UTERUS     x 2     OB History    Gravida  6   Para  3   Term  2   Preterm  1   AB  3   Living  3     SAB  3   TAB      Ectopic      Multiple      Live Births               Home Medications    Prior to Admission medications   Medication Sig Start Date End Date Taking? Authorizing Provider  azithromycin  (ZITHROMAX) 500 MG tablet Take 2 tablets (1,000 mg total) by mouth once. 12/16/14   Levie HeritageStinson, Jacob J, DO  ondansetron (ZOFRAN) 8 MG tablet Take 1 tablet (8 mg total) by mouth every 8 (eight) hours as needed for nausea or vomiting. 11/11/14   Clemmons, Elmore GuiseLori A, CNM    Family History Family History  Problem Relation Age of Onset  . Migraines Mother   . Alopecia Mother   . Sickle cell trait Mother   . Cancer Father        died of lung cancer  . Sickle cell anemia Sister   . Seizures Brother     Social History Social History   Tobacco Use  . Smoking status: Current Every Day Smoker    Packs/day: 0.10    Types: Cigarettes    Start date: 10/21/2011  . Smokeless tobacco: Never Used  Substance Use Topics  . Alcohol use: No    Alcohol/week: 0.0 oz  . Drug use: No     Allergies   Acetaminophen   Review of Systems Review of Systems  All other systems reviewed and are negative.    Physical Exam Updated Vital Signs BP 118/82 (BP  Location: Right Arm)   Pulse 88   Temp 98.2 F (36.8 C) (Oral)   Resp 18   Ht 5\' 3"  (1.6 m)   Wt 56.8 kg (125 lb 5 oz)   LMP 02/02/2018   SpO2 98%   BMI 22.20 kg/m   Physical Exam  Constitutional: She is oriented to person, place, and time. She appears well-developed and well-nourished. No distress.  HENT:  Head: Atraumatic.  Right Ear: External ear normal.  Left Ear: External ear normal.  Nose: Nose normal.  Mouth/Throat: Oropharynx is clear and moist.  Eyes: Conjunctivae are normal.  Neck: Normal range of motion. Neck supple. No tracheal deviation present. No thyromegaly present.  Cardiovascular: Normal rate and regular rhythm.  Pulmonary/Chest: No stridor. She has wheezes. She has rales.  Abdominal: Soft. Bowel sounds are normal. She exhibits no distension. There is no tenderness.  Musculoskeletal: She exhibits no edema.  Lymphadenopathy:    She has no cervical adenopathy.  Neurological: She is alert and oriented to person, place, and  time.  Skin: No rash noted.  Psychiatric: She has a normal mood and affect.  Nursing note and vitals reviewed.    ED Treatments / Results  Labs (all labs ordered are listed, but only abnormal results are displayed) Labs Reviewed  POC URINE PREG, ED    EKG None  Radiology Dg Chest 2 View  Result Date: 02/17/2018 CLINICAL DATA:  Cough, shortness of breath and wheezing, worsening over the last 2 weeks. EXAM: CHEST - 2 VIEW COMPARISON:  09/10/2014. FINDINGS: Normal sized heart. Clear lungs. Mild central peribronchial thickening with improvement. Cholecystectomy clips. Minimal scoliosis. IMPRESSION: Mild bronchitic changes with improvement. Electronically Signed   By: Beckie Salts M.D.   On: 02/17/2018 13:25    Procedures Procedures (including critical care time)  Medications Ordered in ED Medications  albuterol (PROVENTIL HFA;VENTOLIN HFA) 108 (90 Base) MCG/ACT inhaler 2 puff (has no administration in time range)  albuterol (PROVENTIL) (2.5 MG/3ML) 0.083% nebulizer solution 5 mg (5 mg Nebulization Given 02/17/18 1028)  albuterol (PROVENTIL,VENTOLIN) solution continuous neb (10 mg/hr Nebulization Given 02/17/18 1203)  ipratropium (ATROVENT) nebulizer solution 0.5 mg (0.5 mg Nebulization Given 02/17/18 1203)  predniSONE (DELTASONE) tablet 60 mg (60 mg Oral Given 02/17/18 1201)     Initial Impression / Assessment and Plan / ED Course  I have reviewed the triage vital signs and the nursing notes.  Pertinent labs & imaging results that were available during my care of the patient were reviewed by me and considered in my medical decision making (see chart for details).     BP 130/76   Pulse (!) 110   Temp 98.2 F (36.8 C) (Oral)   Resp 18   Ht 5\' 3"  (1.6 m)   Wt 56.8 kg (125 lb 5 oz)   LMP 02/02/2018   SpO2 94%   BMI 22.20 kg/m    Final Clinical Impressions(s) / ED Diagnoses   Final diagnoses:  Mild persistent asthma with exacerbation  Bronchitis    ED Discharge  Orders        Ordered    predniSONE (DELTASONE) 20 MG tablet     02/17/18 1459    benzonatate (TESSALON) 100 MG capsule  Every 8 hours     02/17/18 1459     11:55 AM Patient here with cough, wheeze, consistent with an asthma exacerbation likely due to environmental changes.  She is not well controlled using her inhaler at home.  Therefore, will provide continuous nebs, Atrovent, and  steroid as treatment.  Will obtain chest x-ray.  2:57 PM X-ray demonstrate mild bronchitic changes with improvement.  Patient felt much better after receiving hour-long neb treatment but still endorse some wheezing.  On reexamination, lungs are clear.  No hypoxia on monitor.  Will have patient ambulate and check oxygen status.  3:16 PM Pt felt better when ambulated, normal O2.  Stable for discharge. Return precaution given.  Suspect asthma exacerbation 2/2 bronchitis.    Fayrene Helper, PA-C 02/17/18 1517    Samuel Jester, DO 02/21/18 1335

## 2018-02-17 NOTE — ED Notes (Signed)
Respiratory called for continuous neb 

## 2018-03-02 ENCOUNTER — Emergency Department (HOSPITAL_COMMUNITY)
Admission: EM | Admit: 2018-03-02 | Discharge: 2018-03-02 | Disposition: A | Discharge status: Home / Self Care | Payer: Medicaid Other | Attending: Emergency Medicine | Admitting: Emergency Medicine

## 2018-03-02 ENCOUNTER — Other Ambulatory Visit: Payer: Self-pay

## 2018-03-02 ENCOUNTER — Encounter (HOSPITAL_COMMUNITY): Payer: Self-pay

## 2018-03-02 DIAGNOSIS — F17210 Nicotine dependence, cigarettes, uncomplicated: Secondary | ICD-10-CM | POA: Diagnosis not present

## 2018-03-02 DIAGNOSIS — J45909 Unspecified asthma, uncomplicated: Secondary | ICD-10-CM | POA: Diagnosis not present

## 2018-03-02 DIAGNOSIS — Z79899 Other long term (current) drug therapy: Secondary | ICD-10-CM | POA: Insufficient documentation

## 2018-03-02 DIAGNOSIS — K0889 Other specified disorders of teeth and supporting structures: Secondary | ICD-10-CM | POA: Diagnosis not present

## 2018-03-02 MED ORDER — KETOROLAC TROMETHAMINE 30 MG/ML IJ SOLN
30.0000 mg | Freq: Once | INTRAMUSCULAR | Status: AC
Start: 2018-03-02 — End: 2018-03-02
  Administered 2018-03-02: 30 mg via INTRAMUSCULAR
  Filled 2018-03-02: qty 1

## 2018-03-02 MED ORDER — AMOXICILLIN-POT CLAVULANATE 875-125 MG PO TABS: 1.0000 | ORAL_TABLET | Freq: Two times a day (BID) | ORAL | 0 refills | Status: AC

## 2018-03-02 MED ORDER — IBUPROFEN 800 MG PO TABS: 800.0000 mg | ORAL_TABLET | Freq: Three times a day (TID) | ORAL | 0 refills | Status: AC

## 2018-03-02 MED ORDER — AMOXICILLIN-POT CLAVULANATE 875-125 MG PO TABS
1.0000 | ORAL_TABLET | Freq: Once | ORAL | Status: AC
  Administered 2018-03-02: 1 via ORAL
  Filled 2018-03-02: qty 1

## 2018-03-02 NOTE — ED Provider Notes (Signed)
[facility]-EMERGENCY DEPT Provider Note  CSN: [id number] Arrival date & time: 03/02/18  1717  History   Chief Complaint Chief Complaint  Patient presents with  . Dental Pain  . Facial Swelling    HPI Kathryn Norman is a 37 y.o. female with a medical history of asthma, GERD, anemia and migraines who presented to the ED for left side dental pain and facial swelling x1 day. She states she woke up with left lower jaw swelling and states she a crack in her lower tooth. Associated symptoms: left otalgia. Denies fever, trismus, neck pain, dysphagia, sinus pain. Denies recent falls, traumas or injuries. Patient states she has not been to a dentist in over a year because hers went out of business. She states she has had multiple teeth pulled in the past.  Past Medical History:  Diagnosis Date  . ADHD (attention deficit hyperactivity disorder)   . Anemia   . Anxiety   . Asthma   . Depression   . Migraines     Patient Active Problem List   Diagnosis Date Noted  . Heartburn 11/11/2014  . Galactorrhea 11/11/2014  . Malpositioned IUD 09/07/2012  . Hemorrhagic ovarian cyst 09/07/2012  . Anxiety and depression 06/19/2012  . Pap smear with mild cervical dysplasia 06/19/2012  . Chronic headaches 06/19/2012    Past Surgical History:  Procedure Laterality Date  . CHOLECYSTECTOMY    . DILATION AND CURETTAGE OF UTERUS     x 2     OB History    Gravida  6   Para  3   Term  2   Preterm  1   AB  3   Living  3     SAB  3   TAB      Ectopic      Multiple      Live Births               Home Medications    Prior to Admission medications   Medication Sig Start Date End Date Taking? Authorizing Provider  amoxicillin-clavulanate (AUGMENTIN) 875-125 MG tablet Take 1 tablet by mouth 2 (two) times daily for 5 days. 03/02/18 03/07/18  Mortis, Jerrel Ivory I, PA-C  ARIPiprazole (ABILIFY) 10 MG tablet Take 10 mg by mouth daily.    [provider]    atomoxetine (STRATTERA) 40 MG capsule Take 40 mg by mouth daily.    [provider]  benzonatate (TESSALON) 100 MG capsule Take 1 capsule (100 mg total) by mouth every 8 (eight) hours. 02/17/18   Fayrene Helper, PA-C  ibuprofen (ADVIL,MOTRIN) 800 MG tablet Take 1 tablet (800 mg total) by mouth 3 (three) times daily for 10 days. 03/02/18 03/12/18  Mortis, Jerrel Ivory I, PA-C  mirtazapine (REMERON) 15 MG tablet Take 15 mg by mouth at bedtime.    [provider]  predniSONE (DELTASONE) 20 MG tablet 3 tabs po day one, then 2 tabs daily x 4 days 02/17/18   Fayrene Helper, PA-C  traZODone (DESYREL) 50 MG tablet Take 50 mg by mouth at bedtime.    [provider]    Family History Family History  Problem Relation Age of Onset  . Migraines Mother   . Alopecia Mother   . Sickle cell trait Mother   . Cancer Father        died of lung cancer  . Sickle cell anemia Sister   . Seizures Brother     Social History Social History   Tobacco Use  .  Smoking status: Current Every Day Smoker    Packs/day: 0.10    Types: Cigarettes    Start date: 10/21/2011  . Smokeless tobacco: Never Used  Substance Use Topics  . Alcohol use: No    Alcohol/week: 0.0 oz  . Drug use: No     Allergies   Acetaminophen and Red dye   Review of Systems Review of Systems  Constitutional: Negative for activity change, appetite change, chills and fever.  HENT: Positive for dental problem, ear pain and facial swelling. Negative for drooling, ear discharge, postnasal drip, rhinorrhea, sinus pain, sore throat, tinnitus, trouble swallowing and voice change.   Eyes: Negative for visual disturbance.  Skin: Negative.   Neurological: Negative for headaches.     Physical Exam Updated Vital Signs BP (!) 145/108 (BP Location: Right Arm)   Pulse 60   Temp 98.3 F (36.8 C) (Oral)   Resp 16   Ht 5\' 3"  (1.6 m)   Wt 56.7 kg (125 lb)   LMP 02/02/2018   SpO2 100%   BMI 22.14 kg/m   Physical Exam   Constitutional: She appears well-developed and well-nourished. No distress.  HENT:  Head:    Right Ear: Tympanic membrane, external ear and ear canal normal.  Left Ear: Tympanic membrane, external ear and ear canal normal.  Mouth/Throat: Oropharynx is clear and moist and mucous membranes are normal. No trismus in the jaw. Dental caries present. No dental abscesses or uvula swelling. No posterior oropharyngeal edema or posterior oropharyngeal erythema.    Left lower mandible swelling that is tender to palpation. Non-erythematous. No fluctuance or induration.  Many teeth missing and dental caries seen on those still present. Tooth #20 cracked with root exposed and is tender to palpation.  Lymphadenopathy:       Head (right side): No submental, no submandibular, no tonsillar, no preauricular, no posterior auricular and no occipital adenopathy present.       Head (left side): No submental, no submandibular, no tonsillar, no preauricular, no posterior auricular and no occipital adenopathy present.    She has no cervical adenopathy.  Skin: Skin is warm and intact. Capillary refill takes less than 2 seconds. No lesion and no rash noted. No erythema.  Nursing note and vitals reviewed.    ED Treatments / Results  Labs (all labs ordered are listed, but only abnormal results are displayed) Labs Reviewed - No data to display  EKG None  Radiology No results found.  Procedures Procedures (including critical care time)  Medications Ordered in ED Medications  ketorolac (TORADOL) 30 MG/ML injection 30 mg (30 mg Intramuscular Given 03/02/18 1842)     Initial Impression / Assessment and Plan / ED Course  Triage vital signs and the nursing notes have been reviewed.  Pertinent labs & imaging results that were available during care of the patient were reviewed and considered in medical decision making (see chart for details).   Patient presents in no acute distress. She is afebrile and his  vital signs were normal. Patient denies systemic s/s that would suggest a widespread infection. On physical exam, there is no erythema, swelling, induration or fluctuatnce in the mouth. However, there is significant left lower jaw swelling seen. This area is not well defined, indurated or fluctuant. No erythema or warmth either. Not suggestive of an abscess, but it is possible. Will prescribe prophylactic antibiotics and refer patient to oral surgeon. Education was provided on smoking cessation and appropriate dental hygiene.  Final Clinical Impressions(s) / ED Diagnoses  1. Dental Pain. Prophylactic antibiotics given x5 days. Advised patient to follow-up with dentist ASAP for further evaluation. Education provided on proper dental hygiene and smoking cessation.   Dispo: Home. After thorough clinical evaluation, this patient is determined to be medically stable and can be safely discharged with the previously mentioned treatment and/or outpatient follow-up/referral(s). At this time, there are no other apparent medical conditions that require further screening, evaluation or treatment.  Final diagnoses:  Pain, dental    ED Discharge Orders        Ordered    amoxicillin-clavulanate (AUGMENTIN) 875-125 MG tablet  2 times daily     03/02/18 1847    ibuprofen (ADVIL,MOTRIN) 800 MG tablet  3 times daily     03/02/18 1856        Mortis, PorterGabrielle I, PA-C 03/02/18 [id number]    Wentz, Elliott, MD 03/04/18 [id number]

## 2018-03-02 NOTE — Discharge Instructions (Addendum)
Take full course of antibiotics. For pain, you may take the prescription strength ibuprofen I have given you in addition to Tylenol. Cold compresses to the face may help with swelling as well. You can also buy Orajel or tooth gel for numbing.  I have placed information for our on call dentist and oral surgeon today. I have also provided you with a list of local dental offices that work with Medicaid patients.

## 2018-03-02 NOTE — ED Triage Notes (Signed)
Patient reports that she woke this AM with left dental pain and facial swelling. No SOB or difficulty swallowing.

## 2018-06-25 ENCOUNTER — Other Ambulatory Visit (HOSPITAL_COMMUNITY)
Admission: RE | Admit: 2018-06-25 | Discharge: 2018-06-25 | Disposition: A | Discharge status: Home / Self Care | Payer: Medicaid Other | Source: Ambulatory Visit | Attending: Obstetrics & Gynecology | Admitting: Obstetrics & Gynecology

## 2018-06-25 ENCOUNTER — Ambulatory Visit (INDEPENDENT_AMBULATORY_CARE_PROVIDER_SITE_OTHER): Payer: Medicaid Other | Admitting: Obstetrics & Gynecology

## 2018-06-25 ENCOUNTER — Encounter: Payer: Self-pay | Admitting: Obstetrics & Gynecology

## 2018-06-25 VITALS — BP 112/73 | HR 64 | Wt 127.0 lb

## 2018-06-25 DIAGNOSIS — Z01419 Encounter for gynecological examination (general) (routine) without abnormal findings: Secondary | ICD-10-CM

## 2018-06-25 DIAGNOSIS — R87810 Cervical high risk human papillomavirus (HPV) DNA test positive: Secondary | ICD-10-CM | POA: Insufficient documentation

## 2018-06-25 DIAGNOSIS — N911 Secondary amenorrhea: Secondary | ICD-10-CM

## 2018-06-25 DIAGNOSIS — B3731 Acute candidiasis of vulva and vagina: Secondary | ICD-10-CM

## 2018-06-25 DIAGNOSIS — Z0000 Encounter for general adult medical examination without abnormal findings: Secondary | ICD-10-CM | POA: Diagnosis not present

## 2018-06-25 DIAGNOSIS — B373 Candidiasis of vulva and vagina: Secondary | ICD-10-CM

## 2018-06-25 DIAGNOSIS — A5901 Trichomonal vulvovaginitis: Secondary | ICD-10-CM

## 2018-06-25 MED ORDER — MEDROXYPROGESTERONE ACETATE 10 MG PO TABS
10.0000 mg | ORAL_TABLET | Freq: Every day | ORAL | 2 refills | Status: DC
Start: 2018-06-25 — End: 2018-11-22

## 2018-06-25 NOTE — Patient Instructions (Addendum)
Provera Test (makes you have a period) Provera 10 mg daily x 10 days and see if she gets her period usually within 7 days.  If not, she will have to do an estrogen-progestin challenge which is Estradiol 2 mg daily x 21 days followed by Provera 10 mg daily x 7 days to see if this results in a period.     Secondary Amenorrhea Secondary amenorrhea is the stopping of menstrual flow for 3-6 months in a female who has previously had periods. There are many possible causes. Most of these causes are not serious. Usually, treating the underlying problem causing the loss of menses will return your periods to normal. What are the causes? Some common and uncommon causes of not menstruating include:  Malnutrition.  Low blood sugar (hypoglycemia).  Polycystic ovary disease.  Stress or fear.  Breastfeeding.  Hormone imbalance.  Ovarian failure.  Medicines.  Extreme obesity.  Cystic fibrosis.  Low body weight or drastic weight reduction from any cause.  Early menopause.  Removal of ovaries or uterus.  Contraceptives.  Illness.  Long-term (chronic) illnesses.  Cushing syndrome.  Thyroid problems.  Birth control pills, patches, or vaginal rings for birth control.  What increases the risk? You may be at greater risk of secondary amenorrhea if:  You have a family history of this condition.  You have an eating disorder.  You do athletic training.  How is this diagnosed? A diagnosis is made by your health care provider taking a medical history and doing a physical exam. This will include a pelvic exam to check for problems with your reproductive organs. Pregnancy must be ruled out. Often, numerous blood tests are done to measure different hormones in the body. Urine testing may be done. Specialized exams (ultrasound, CT scan, MRI, or hysteroscopy) may have to be done as well as measuring the body mass index (BMI). How is this treated? Treatment depends on the cause of the  amenorrhea. If an eating disorder is present, this can be treated with an adequate diet and therapy. Chronic illnesses may improve with treatment of the illness. Amenorrhea may be corrected with medicines, lifestyle changes, or surgery. If the amenorrhea cannot be corrected, it is sometimes possible to create a false menstruation with medicines. Follow these instructions at home:  Maintain a healthy diet.  Manage weight problems.  Exercise regularly but not excessively.  Get adequate sleep.  Manage stress.  Be aware of changes in your menstrual cycle. Keep a record of when your periods occur. Note the date your period starts, how long it lasts, and any problems. Contact a health care provider if: Your symptoms do not get better with treatment. This information is not intended to replace advice given to you by your health care provider. Make sure you discuss any questions you have with your health care provider. Document Released: 09/23/2006 Document Revised: 01/18/2016 Document Reviewed: 01/28/2013 Elsevier Interactive Patient Education  2018 [place] 18-39 Years, Female Preventive care refers to lifestyle choices and visits with your health care provider that can promote health and wellness. What does preventive care include?  A yearly physical exam. This is also called an annual well check.  Dental exams once or twice a year.  Routine eye exams. Ask your health care provider how often you should have your eyes checked.  Personal lifestyle choices, including: ? Daily care of your teeth and gums. ? Regular physical activity. ? Eating a healthy diet. ? Avoiding tobacco and drug  use. ? Limiting alcohol use. ? Practicing safe sex. ? Taking vitamin and mineral supplements as recommended by your health care provider. What happens during an annual well check? The services and screenings done by your health care provider during your annual well check will  depend on your age, overall health, lifestyle risk factors, and family history of disease. Counseling Your health care provider may ask you questions about your:  Alcohol use.  Tobacco use.  Drug use.  Emotional well-being.  Home and relationship well-being.  Sexual activity.  Eating habits.  Work and work Statistician.  Method of birth control.  Menstrual cycle.  Pregnancy history.  Screening You may have the following tests or measurements:  Height, weight, and BMI.  Diabetes screening. This is done by checking your blood sugar (glucose) after you have not eaten for a while (fasting).  Blood pressure.  Lipid and cholesterol levels. These may be checked every 5 years starting at age 23.  Skin check.  Hepatitis C blood test.  Hepatitis B blood test.  Sexually transmitted disease (STD) testing.  BRCA-related cancer screening. This may be done if you have a family history of breast, ovarian, tubal, or peritoneal cancers.  Pelvic exam and Pap test. This may be done every 3 years starting at age 98. Starting at age 74, this may be done every 5 years if you have a Pap test in combination with an HPV test.  Discuss your test results, treatment options, and if necessary, the need for more tests with your health care provider. Vaccines Your health care provider may recommend certain vaccines, such as:  Influenza vaccine. This is recommended every year.  Tetanus, diphtheria, and acellular pertussis (Tdap, Td) vaccine. You may need a Td booster every 10 years.  Varicella vaccine. You may need this if you have not been vaccinated.  HPV vaccine. If you are 16 or younger, you may need three doses over 6 months.  Measles, mumps, and rubella (MMR) vaccine. You may need at least one dose of MMR. You may also need a second dose.  Pneumococcal 13-valent conjugate (PCV13) vaccine. You may need this if you have certain conditions and were not previously  vaccinated.  Pneumococcal polysaccharide (PPSV23) vaccine. You may need one or two doses if you smoke cigarettes or if you have certain conditions.  Meningococcal vaccine. One dose is recommended if you are age 45-21 years and a first-year college student living in a residence hall, or if you have one of several medical conditions. You may also need additional booster doses.  Hepatitis A vaccine. You may need this if you have certain conditions or if you travel or work in places where you may be exposed to hepatitis A.  Hepatitis B vaccine. You may need this if you have certain conditions or if you travel or work in places where you may be exposed to hepatitis B.  Haemophilus influenzae type b (Hib) vaccine. You may need this if you have certain risk factors.  Talk to your health care provider about which screenings and vaccines you need and how often you need them. This information is not intended to replace advice given to you by your health care provider. Make sure you discuss any questions you have with your health care provider. Document Released: 10/08/2001 Document Revised: 05/01/2016 Document Reviewed: 06/13/2015 Elsevier Interactive Patient Education  2018 [place] you for enrolling in [employer]. Please follow the instructions below to securely access your online medical record. MyChart allows  you to send messages to your doctor, view your test results, manage appointments, and more.   How Do I Sign Up? 1. In your Internet browser, go to [employer] and enter [email or web address]. 2. Click on the Sign Up Now link in the Sign In box. You will see the New Member Sign Up page. 3. Enter your MyChart Access Code exactly as it appears below. You will not need to use this code after you've completed the sign-up process. If you do not sign up before the expiration date, you must request a new code.  MyChart Access Code: [email or web address] Expires: 08/09/2018 11:28  AM  4. Enter your Social Security Number ([id number]) and Date of Birth (mm/dd/yyyy) as indicated and click Submit. You will be taken to the next sign-up page. 5. Create a MyChart ID. This will be your MyChart login ID and cannot be changed, so think of one that is secure and easy to remember. 6. Create a MyChart password. You can change your password at any time. 7. Enter your Password Reset Question and Answer. This can be used at a later time if you forget your password.  8. Enter your e-mail address. You will receive e-mail notification when new information is available in [place]. 9. Click Sign Up. You can now view your medical record.   Additional Information Remember, MyChart is NOT to be used for urgent needs. For medical emergencies, dial 911.

## 2018-06-25 NOTE — Progress Notes (Signed)
GYNECOLOGY ANNUAL PREVENTATIVE CARE ENCOUNTER NOTE  Subjective:   Kathryn Norman is a 37 y.o. [phone] female here for a routine annual gynecologic exam.  Current complaints: has not had a period since July. Not on any birth control. Reports having a lot of stress in her life, also worried about side effects of her medications (see list below). Had regula rperiods before this. No recent big weight changes.   Denies abnormal vaginal bleeding, discharge, pelvic pain, problems with intercourse or other gynecologic concerns.    Gynecologic History No LMP recorded. Contraception: coitus interruptus Last Pap: 10/18/2014. Results were: normal with positive HRHPV and chlamydia   Obstetric History OB History  Gravida Para Term Preterm AB Living  6 3 2 1 3 3   SAB TAB Ectopic Multiple Live Births  3            # Outcome Date GA Lbr Len/2nd Weight Sex Delivery Anes PTL Lv  6 SAB           5 SAB           4 SAB           3 Preterm           2 Term           1 Term             Past Medical History:  Diagnosis Date  . ADHD (attention deficit hyperactivity disorder)   . Anemia   . Anxiety and depression 06/19/2012  . Asthma   . Chlamydia infection 10/18/2014   Treated  . Depression   . Hemorrhagic ovarian cyst 09/07/2012  . Migraines     Past Surgical History:  Procedure Laterality Date  . CHOLECYSTECTOMY    . DILATION AND CURETTAGE OF UTERUS     x 2    Current Outpatient Medications on File Prior to Visit  Medication Sig Dispense Refill  . ARIPiprazole (ABILIFY) 10 MG tablet Take 10 mg by mouth daily.    Marland Kitchen atomoxetine (STRATTERA) 40 MG capsule Take 40 mg by mouth daily.    . mirtazapine (REMERON) 15 MG tablet Take 15 mg by mouth at bedtime.    . traZODone (DESYREL) 50 MG tablet Take 50 mg by mouth at bedtime.    . benzonatate (TESSALON) 100 MG capsule Take 1 capsule (100 mg total) by mouth every 8 (eight) hours. (Patient not taking: Reported on 06/25/2018) 21 capsule 0  .  predniSONE (DELTASONE) 20 MG tablet 3 tabs po day one, then 2 tabs daily x 4 days (Patient not taking: Reported on 06/25/2018) 11 tablet 0   No current facility-administered medications on file prior to visit.     Allergies  Allergen Reactions  . Acetaminophen Nausea And Vomiting  . Red Dye Rash    Social History:  reports that she has quit smoking. Her smoking use included cigarettes. She started smoking about 6 years ago. She smoked 0.10 packs per day. She has never used smokeless tobacco. She reports that she does not drink alcohol or use drugs.  Family History  Problem Relation Age of Onset  . Migraines Mother   . Alopecia Mother   . Sickle cell trait Mother   . Cancer Father        died of lung cancer  . Sickle cell anemia Sister   . Seizures Brother     The following portions of the patient's history were reviewed and updated as appropriate: allergies, current medications,  past family history, past medical history, past social history, past surgical history and problem list.  Review of Systems Pertinent items noted in HPI and remainder of comprehensive ROS otherwise negative.   Objective:  BP 112/73   Pulse 64   Wt 127 lb (57.6 kg)   BMI 22.50 kg/m  CONSTITUTIONAL: Well-developed, well-nourished female in no acute distress.  HENT:  Normocephalic, atraumatic, External right and left ear normal. Oropharynx is clear and moist EYES: Conjunctivae and EOM are normal. Pupils are equal, round, and reactive to light. No scleral icterus.  NECK: Normal range of motion, supple, no masses.  Normal thyroid.  SKIN: Skin is warm and dry. No rash noted. Not diaphoretic. No erythema. No pallor. MUSCULOSKELETAL: Normal range of motion. No tenderness.  No cyanosis, clubbing, or edema.  2+ distal pulses. NEUROLOGIC: Alert and oriented to person, place, and time. Normal reflexes, muscle tone coordination. No cranial nerve deficit noted. PSYCHIATRIC: Normal mood and affect. Normal behavior.  Normal judgment and thought content. CARDIOVASCULAR: Normal heart rate noted, regular rhythm RESPIRATORY: Clear to auscultation bilaterally. Effort and breath sounds normal, no problems with respiration noted. BREASTS: Symmetric in size. No masses, skin changes, nipple drainage, or lymphadenopathy. ABDOMEN: Soft, normal bowel sounds, no distention noted.  No tenderness, rebound or guarding.  PELVIC: Normal appearing external genitalia; normal appearing vaginal mucosa and cervix.  Thin, white discharge noted, testing sample obtained.  Pap smear obtained.  Normal uterine size, no other palpable masses, no uterine or adnexal tenderness.   Assessment and Plan:  1. Secondary amenorrhea Amenorrhea for 3 months. Likely due to stress, but worried about premature ovarian failure too or medication side effect (quick look up on Epocrates did not list amenorrhea as an adverse effect of her medications). Will check labs, do Provera challenge : Provera 10 mg daily x 10 days and see if she gets her period usually within 7 days.  If not, she will have to do an estrogen-progestin challenge which is Estradiol 2 mg daily x 21 days followed by Provera 10 mg daily x 7 days to see if this results in a period.   - DHEA-sulfate - Follicle stimulating hormone - Beta hCG quant (ref lab) - TSH+Prl+TestT+TestF+17OHP - medroxyPROGESTERone (PROVERA) 10 MG tablet; Take 1 tablet (10 mg total) by mouth daily. Use for ten days  Dispense: 10 tablet; Refill: 2  2. Well woman exam with routine gynecological exam 3. 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 - Hepatitis B surface antigen - Hepatitis C antibody - HIV Antibody (routine testing w rflx) - RPR - Cervicovaginal ancillary only - Cytology - PAP Routine preventative health maintenance measures emphasized. Please refer to After Visit Summary for other counseling  recommendations.    Jaynie Collins, MD, FACOG Obstetrician & Gynecologist, [facility] for [employer], [facility]

## 2018-06-26 LAB — CERVICOVAGINAL ANCILLARY ONLY
Bacterial vaginitis: NEGATIVE
CANDIDA VAGINITIS: POSITIVE — AB
Chlamydia: NEGATIVE
Neisseria Gonorrhea: NEGATIVE
TRICH (WINDOWPATH): POSITIVE — AB

## 2018-06-26 MED ORDER — METRONIDAZOLE 500 MG PO TABS: ORAL_TABLET | ORAL | 0 refills | Status: DC

## 2018-06-26 MED ORDER — FLUCONAZOLE 150 MG PO TABS: 150.0000 mg | ORAL_TABLET | Freq: Once | ORAL | 3 refills | Status: AC

## 2018-06-26 NOTE — Progress Notes (Signed)
Patient has Trichomonas infection.  Testing for other STIs is still pending (GC/Chlam are negative thus far).  She also has a yeast infection (not a STI).  She needs to let her sex partner(s) know so the partner(s) can get testing and treatment. Patient and sex partner(s) should abstain from unprotected sexual activity for seven days after everyone receives appropriate treatment.  Metronidazole was prescribed for patient for the Trichomonas; Diflucan for the yeast vaginitis.  Patient will need to return in about 4 weeks after treatment for repeat test of cure.  Please call to inform patient of results and recommendations, and advise to pick up prescription and take as directed.

## 2018-06-26 NOTE — Progress Notes (Signed)
Patient has Trichomonas infection.  Testing for other STIs is still pending at the time of this note (GC/Chlam are negative thus far).  She also has a yeast infection (not a STI).  She needs to let her sex partner(s) know so the partner(s) can get testing and treatment. Patient and sex partner(s) should abstain from unprotected sexual activity for seven days after everyone receives appropriate treatment.  Metronidazole was prescribed for patient for the Trichomonas; Diflucan for the yeast vaginitis.  Patient will need to return in about 4 weeks after treatment for repeat test of cure.  Please call to inform patient of results and recommendations, and advise to pick up prescription and take as directed.  Jaynie Collins, MD

## 2018-06-26 NOTE — Addendum Note (Signed)
Addended by: Jaynie Collins A on: 06/26/2018 07:29 PM   Modules accepted: Orders

## 2018-06-29 LAB — CYTOLOGY - PAP
Diagnosis: NEGATIVE
HPV (WINDOPATH): NOT DETECTED

## 2018-06-30 ENCOUNTER — Telehealth: Payer: Self-pay | Admitting: *Deleted

## 2018-06-30 LAB — TSH+PRL+TESTT+TESTF+17OHP
17-Hydroxyprogesterone: 48 ng/dL
PROLACTIN: 4.4 ng/mL — AB (ref 4.8–23.3)
TSH: 0.653 u[IU]/mL (ref 0.450–4.500)
Testosterone, Free: 0.4 pg/mL (ref 0.0–4.2)
Testosterone, total: 20 ng/dL (ref 10.0–55.0)

## 2018-06-30 LAB — DHEA-SULFATE: DHEA-SO4: 212.8 ug/dL (ref 57.3–279.2)

## 2018-06-30 LAB — HIV ANTIBODY (ROUTINE TESTING W REFLEX): HIV SCREEN 4TH GENERATION: NONREACTIVE

## 2018-06-30 LAB — HEPATITIS C ANTIBODY: Hep C Virus Ab: <0.1 s/co ratio (ref 0.0–0.9)

## 2018-06-30 LAB — HEPATITIS B SURFACE ANTIGEN: Hepatitis B Surface Ag: NEGATIVE

## 2018-06-30 LAB — FOLLICLE STIMULATING HORMONE: FSH: 46.4 m[IU]/mL

## 2018-06-30 LAB — RPR: RPR Ser Ql: NONREACTIVE

## 2018-06-30 LAB — BETA HCG QUANT (REF LAB): hCG Quant: <1 m[IU]/mL

## 2018-06-30 NOTE — Telephone Encounter (Signed)
Left message for her to call to discuss results.

## 2018-07-24 DIAGNOSIS — N939 Abnormal uterine and vaginal bleeding, unspecified: Secondary | ICD-10-CM

## 2018-07-28 MED ORDER — MEGESTROL ACETATE 40 MG PO TABS: ORAL_TABLET | ORAL | 0 refills | Status: DC

## 2018-07-29 ENCOUNTER — Other Ambulatory Visit: Payer: Self-pay | Admitting: *Deleted

## 2018-07-29 DIAGNOSIS — N939 Abnormal uterine and vaginal bleeding, unspecified: Secondary | ICD-10-CM

## 2018-07-29 MED ORDER — MEGESTROL ACETATE 40 MG PO TABS: ORAL_TABLET | ORAL | 0 refills | Status: DC

## 2018-10-19 ENCOUNTER — Other Ambulatory Visit: Payer: Self-pay

## 2018-10-19 DIAGNOSIS — N939 Abnormal uterine and vaginal bleeding, unspecified: Secondary | ICD-10-CM

## 2018-10-19 DIAGNOSIS — A5901 Trichomonal vulvovaginitis: Secondary | ICD-10-CM

## 2018-10-20 MED ORDER — METRONIDAZOLE 500 MG PO TABS: ORAL_TABLET | ORAL | 0 refills | Status: DC

## 2018-10-20 MED ORDER — MEGESTROL ACETATE 40 MG PO TABS: ORAL_TABLET | ORAL | 0 refills | Status: DC

## 2018-11-20 ENCOUNTER — Other Ambulatory Visit: Payer: Self-pay

## 2018-11-20 ENCOUNTER — Ambulatory Visit (INDEPENDENT_AMBULATORY_CARE_PROVIDER_SITE_OTHER): Payer: Medicaid Other | Admitting: Physician Assistant

## 2018-11-20 ENCOUNTER — Other Ambulatory Visit: Payer: Self-pay | Admitting: Obstetrics & Gynecology

## 2018-11-20 DIAGNOSIS — N911 Secondary amenorrhea: Secondary | ICD-10-CM

## 2018-11-20 DIAGNOSIS — G43101 Migraine with aura, not intractable, with status migrainosus: Secondary | ICD-10-CM | POA: Diagnosis not present

## 2018-11-23 ENCOUNTER — Encounter: Payer: Self-pay | Admitting: Physician Assistant

## 2018-11-23 DIAGNOSIS — R441 Visual hallucinations: Secondary | ICD-10-CM | POA: Insufficient documentation

## 2018-11-23 DIAGNOSIS — F319 Bipolar disorder, unspecified: Secondary | ICD-10-CM | POA: Insufficient documentation

## 2018-11-23 DIAGNOSIS — F329 Major depressive disorder, single episode, unspecified: Secondary | ICD-10-CM | POA: Insufficient documentation

## 2018-11-23 DIAGNOSIS — G43101 Migraine with aura, not intractable, with status migrainosus: Secondary | ICD-10-CM | POA: Insufficient documentation

## 2018-11-23 MED ORDER — SUMATRIPTAN SUCCINATE 100 MG PO TABS: 100.0000 mg | ORAL_TABLET | Freq: Once | ORAL | 2 refills | Status: AC | PRN

## 2018-11-23 MED ORDER — IBUPROFEN 800 MG PO TABS: 800.0000 mg | ORAL_TABLET | Freq: Three times a day (TID) | ORAL | 1 refills | Status: DC | PRN

## 2018-11-23 MED ORDER — GALCANEZUMAB-GNLM 120 MG/ML ~~LOC~~ SOAJ: SUBCUTANEOUS | 2 refills | Status: DC

## 2018-11-23 NOTE — Progress Notes (Signed)
TELEHEALTH VIRTUAL HEADACHE VISIT ENCOUNTER NOTE  I connected with Kathryn Norman on 11/23/18 at  8:45 AM EDT by telephone at home and verified that I am speaking with the correct person using two identifiers.   I discussed the limitations, risks, security and privacy concerns of performing an evaluation and management service by telephone and the availability of in person appointments. I also discussed with the patient that there may be a patient responsible charge related to this service. The patient expressed understanding and agreed to proceed.   History:  Kathryn Norman is a 38 y.o. [phone] female being evaluated today for headache.  She has not been seen for this before, although she notes her headaches started over 20 years ago, when she was in middle school.  The headaches are severe and have been continuous for over a month now.  It is typical for it to last a few days. The pain is located over her left eye but sometimes shoots to the right eye.  It is a vice grip that is throbbing.  She reports movement, lights and noise make the pain worse.  She admits to nausea/vomitting.  She notes waves of light prior to her headaches.  Triggers include stress, scents, weather changes, bright lights.  Cold helps. Her mother has also gotten headaches like these.   She has tried Tylenol, excedrin, ibuprofen, morphine, benadryl for acute relief.  They help for a short time and then the pain returns. She has gotten morphine from a family member with sickle cell.  She has been to the hospital for IV treatment.  She has used Imitrex before and notes it worked better than anything else.   She currently lives in a hotel.   She has a medical history including asthma, Bipolar disorder, PTSD, Major Depressive Disorder, Hallucinations.  [facility] at [facility].     Past Medical History:  Diagnosis Date  . ADHD (attention deficit hyperactivity disorder)   . Anemia   . Anxiety and depression 06/19/2012  .  Asthma   . Chlamydia infection 10/18/2014   Treated  . Depression   . Hemorrhagic ovarian cyst 09/07/2012  . Migraines    Past Surgical History:  Procedure Laterality Date  . CHOLECYSTECTOMY    . DILATION AND CURETTAGE OF UTERUS     x 2   The following portions of the patient's history were reviewed and updated as appropriate: allergies, current medications, past family history, past medical history, past social history, past surgical history and problem list.     Review of Systems:  Pertinent items noted in HPI and remainder of comprehensive ROS otherwise negative.  Physical Exam:  Physical exam deferred due to nature of the encounter  Labs and Imaging No results found for this or any previous visit (from the past 336 hour(s)). No results found.    Assessment and Plan:     1. Migraine with aura and with status migrainosus, not intractable     Pt will begin Emgality to help with prevention of migraine.   She will use Imitrex for acute migraine.  She may add Ibuprofen for improved efficacy.  Limit imitrex to 200mg  per day, separated by 2 hours.  She should limit ibuprofen to 3/day and 2 days per week and no more than 1 at a time, separated by 8 hours and taken with food.   I discussed the assessment and treatment plan with the patient. The patient was provided an opportunity to ask questions and  all were answered. The patient agreed with the plan and demonstrated an understanding of the instructions. RTC 1 month   Pt advised to get PCP to address other concerns.  See ED for emergent issues.  The patient was advised to call back or seek an in-person evaluation/go to the ED if the symptoms worsen or if the condition fails to improve as anticipated.  I provided25 minutes of non-face-to-face time during this encounter.     Bertram Denver, PA-C [facility] [employer], [facility]

## 2018-11-23 NOTE — Patient Instructions (Signed)

## 2018-12-05 ENCOUNTER — Other Ambulatory Visit: Payer: Self-pay

## 2018-12-05 DIAGNOSIS — N939 Abnormal uterine and vaginal bleeding, unspecified: Secondary | ICD-10-CM

## 2018-12-07 ENCOUNTER — Telehealth: Payer: Self-pay | Admitting: Radiology

## 2018-12-07 NOTE — Telephone Encounter (Signed)
Left message to call [id number] to schedule a virtual visit with Dr Macon Large so that refill for megace can be refilled. Patient was to follow- up with MD

## 2018-12-08 ENCOUNTER — Encounter: Payer: Self-pay | Admitting: Obstetrics & Gynecology

## 2018-12-08 DIAGNOSIS — N912 Amenorrhea, unspecified: Secondary | ICD-10-CM | POA: Insufficient documentation

## 2018-12-18 DIAGNOSIS — N939 Abnormal uterine and vaginal bleeding, unspecified: Secondary | ICD-10-CM

## 2018-12-21 ENCOUNTER — Encounter: Payer: Self-pay | Admitting: *Deleted

## 2018-12-21 MED ORDER — MEGESTROL ACETATE 40 MG PO TABS: 40.0000 mg | ORAL_TABLET | Freq: Two times a day (BID) | ORAL | 3 refills | Status: DC

## 2019-01-03 ENCOUNTER — Other Ambulatory Visit: Payer: Self-pay | Admitting: Physician Assistant

## 2019-02-04 ENCOUNTER — Other Ambulatory Visit: Payer: Self-pay

## 2019-02-04 ENCOUNTER — Encounter (HOSPITAL_COMMUNITY): Payer: Self-pay

## 2019-02-04 ENCOUNTER — Emergency Department (HOSPITAL_COMMUNITY)
Admission: EM | Admit: 2019-02-04 | Discharge: 2019-02-04 | Disposition: A | Discharge status: Home / Self Care | Payer: Medicaid Other | Attending: Emergency Medicine | Admitting: Emergency Medicine

## 2019-02-04 DIAGNOSIS — Z79899 Other long term (current) drug therapy: Secondary | ICD-10-CM | POA: Insufficient documentation

## 2019-02-04 DIAGNOSIS — J4521 Mild intermittent asthma with (acute) exacerbation: Secondary | ICD-10-CM | POA: Insufficient documentation

## 2019-02-04 DIAGNOSIS — Z87891 Personal history of nicotine dependence: Secondary | ICD-10-CM | POA: Diagnosis not present

## 2019-02-04 DIAGNOSIS — R0602 Shortness of breath: Secondary | ICD-10-CM | POA: Diagnosis present

## 2019-02-04 DIAGNOSIS — Z760 Encounter for issue of repeat prescription: Secondary | ICD-10-CM | POA: Diagnosis not present

## 2019-02-04 MED ORDER — ALBUTEROL SULFATE HFA 108 (90 BASE) MCG/ACT IN AERS: 1.0000 | INHALATION_SPRAY | Freq: Four times a day (QID) | RESPIRATORY_TRACT | 1 refills | Status: DC | PRN

## 2019-02-04 NOTE — ED Triage Notes (Signed)
Patient c/o dry coughing that is making her vomit for 2 months.   Patient c/o shob "for a while" and Monday shob got worse.   Patient has been out of her inhaler for 2-3 weeks.   Denies being nausea Denies abdominal Denies diarrhea   Hx. 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

## 2019-02-04 NOTE — ED Provider Notes (Signed)
[facility]   CSN: [id number] Arrival date & time: 02/04/19  1703     History   Chief Complaint Chief Complaint  Patient presents with  . Asthma  . Medication Refill    HPI Kathryn Norman is a 38 y.o. female.  She has a history of asthma and has been more short of breath for the last month due to being out of her asthma inhaler and working in a hot environment.  She is had a cough although denies any fever.  No chest pain.  She has been able to see a PCP.  She has no other complaints or concerns.  Denies any recent travel or sick exposures.     The history is provided by the patient.  Asthma This is a chronic problem. The current episode started more than 1 week ago. The problem occurs daily. The problem has been gradually worsening. Associated symptoms include shortness of breath. Pertinent negatives include no chest pain and no abdominal pain. The symptoms are aggravated by exertion and coughing. Nothing relieves the symptoms. She has tried nothing for the symptoms. The treatment provided no relief.  Medication Refill Medications/supplies requested:  Proair Reason for request:  Clinic/provider not available Medications taken before: yes - see home medications     Past Medical History:  Diagnosis Date  . ADHD (attention deficit hyperactivity disorder)   . Anemia   . Anxiety and depression 06/19/2012  . Asthma   . Chlamydia infection 10/18/2014   Treated  . Depression   . Hemorrhagic ovarian cyst 09/07/2012  . Migraines     Patient Active Problem List   Diagnosis Date Noted  . Amenorrhea 12/08/2018  . Migraine with aura and with status migrainosus, not intractable 11/23/2018  . Bipolar disorder, unspecified (HCC) 11/23/2018  . Visual hallucination 11/23/2018  . Major depressive disorder 11/23/2018  . Pap smear of cervix on 10/18/2014 shows high risk HPV present 06/25/2018  . Anxiety and depression 06/19/2012  .  Chronic headaches 06/19/2012    Past Surgical History:  Procedure Laterality Date  . CHOLECYSTECTOMY    . DILATION AND CURETTAGE OF UTERUS     x 2     OB History    Gravida  6   Para  3   Term  2   Preterm  1   AB  3   Living  3     SAB  3   TAB      Ectopic      Multiple      Live Births               Home Medications    Prior to Admission medications   Medication Sig Start Date End Date Taking? Authorizing Provider  ARIPiprazole (ABILIFY) 10 MG tablet Take 10 mg by mouth daily.    [provider]  atomoxetine (STRATTERA) 40 MG capsule Take 40 mg by mouth daily.    [provider]  benzonatate (TESSALON) 100 MG capsule Take 1 capsule (100 mg total) by mouth every 8 (eight) hours. Patient not taking: Reported on 06/25/2018 02/17/18   Fayrene Helperran, [place], PA-C  Galcanezumab-gnlm [facility](EMGALITY) 120 MG/ML SOAJ Inject 240 mg into the skin as directed AND 120 mg every 30 (thirty) days. Inj 240mg  once then 120mg  monthly. 11/23/18   Glyn Adeeague Clark, Scot JunKaren E, PA-C  ibuprofen (ADVIL,MOTRIN) 800 MG tablet Take 1 tablet (800 mg total) by mouth every 8 (eight) hours as needed. 11/23/18  Jaclyn Prime, Collene Leyden, PA-C  medroxyPROGESTERone (PROVERA) 10 MG tablet TAKE 1 TABLET (10 MG TOTAL) BY MOUTH DAILY. USE FOR TEN DAYS 11/22/18   Anyanwu, Sallyanne Havers, MD  megestrol (MEGACE) 40 MG tablet Take 1 tablet (40 mg total) by mouth 2 (two) times daily. 12/21/18   Anyanwu, Sallyanne Havers, MD  metroNIDAZOLE (FLAGYL) 500 MG tablet Take two tablets by mouth twice a day, for one day.  Or you can take all four tablets at once if you can tolerate it. 10/20/18   Anyanwu, Sallyanne Havers, MD  mirtazapine (REMERON) 15 MG tablet Take 15 mg by mouth at bedtime.    [provider]  predniSONE (DELTASONE) 20 MG tablet 3 tabs po day one, then 2 tabs daily x 4 days Patient not taking: Reported on 06/25/2018 02/17/18   Domenic Moras, PA-C  SUMAtriptan (IMITREX) 100 MG tablet Take 1 tablet (100 mg total) by mouth  once as needed for up to 1 dose for migraine. May repeat in 2 hours if headache persists or recurs. 11/23/18   Jaclyn Prime, Collene Leyden, PA-C  traZODone (DESYREL) 50 MG tablet Take 50 mg by mouth at bedtime.    [provider]    Family History Family History  Problem Relation Age of Onset  . Migraines Mother   . Alopecia Mother   . Sickle cell trait Mother   . Cancer Father        died of lung cancer  . Sickle cell anemia Sister   . Seizures Brother     Social History Social History   Tobacco Use  . Smoking status: Former Smoker    Packs/day: 0.10    Types: Cigarettes    Start date: 10/21/2011  . Smokeless tobacco: Never Used  Substance Use Topics  . Alcohol use: No    Alcohol/week: 0.0 standard drinks  . Drug use: No     Allergies   Acetaminophen and Red dye   Review of Systems Review of Systems  Constitutional: Negative for fever.  HENT: Negative for sore throat.   Respiratory: Positive for cough and shortness of breath.   Cardiovascular: Negative for chest pain.  Gastrointestinal: Negative for abdominal pain.     Physical Exam Updated Vital Signs BP 118/84 (BP Location: Right Arm)   Pulse 77   Temp 98.9 F (37.2 C) (Oral)   Resp 18   LMP 01/04/2019   SpO2 100%   Physical Exam Constitutional:      Appearance: She is well-developed.  HENT:     Head: Normocephalic and atraumatic.  Eyes:     Conjunctiva/sclera: Conjunctivae normal.  Neck:     Musculoskeletal: Neck supple.  Cardiovascular:     Rate and Rhythm: Normal rate and regular rhythm.     Pulses: Normal pulses.  Pulmonary:     Effort: Pulmonary effort is normal. No respiratory distress.     Breath sounds: No wheezing or rhonchi.  Skin:    General: Skin is warm and dry.     Capillary Refill: Capillary refill takes less than 2 seconds.  Neurological:     General: No focal deficit present.     Mental Status: She is alert.     GCS: GCS eye subscore is 4. GCS verbal subscore is 5. GCS  motor subscore is 6.      ED Treatments / Results  Labs (all labs ordered are listed, but only abnormal results are displayed) Labs Reviewed - No data to display  EKG  Radiology No results found.  Procedures Procedures (including critical care time)  Medications Ordered in ED Medications - No data to display   Initial Impression / Assessment and Plan / ED Course  I have reviewed the triage vital signs and the nursing notes.  Pertinent labs & imaging results that were available during my care of the patient were reviewed by me and considered in my medical decision making (see chart for details).      well appearing, sats good, lungs clear. Will prescribe inhaler.   Final Clinical Impressions(s) / ED Diagnoses   Final diagnoses:  Exacerbation of intermittent asthma, unspecified asthma severity    ED Discharge Orders         Ordered    albuterol (VENTOLIN HFA) 108 (90 Base) MCG/ACT inhaler  Every 6 hours PRN     02/04/19 1911           Terrilee FilesButler, Fay Swider C, MD 02/05/19 [phone]

## 2019-02-04 NOTE — Discharge Instructions (Addendum)
You were seen in the emergency department for increased shortness of breath after you ran out of your inhaler.  We have sent off a prescription to your pharmacy for another inhaler.  Please drink plenty of fluids, and use the medication as prescribed.  Return if any worsening symptoms.

## 2019-02-04 NOTE — ED Notes (Signed)
Bed: WTR8 Expected date:  Expected time:  Means of arrival:  Comments: 

## 2019-02-21 ENCOUNTER — Other Ambulatory Visit: Payer: Self-pay

## 2019-02-21 ENCOUNTER — Encounter (HOSPITAL_COMMUNITY): Payer: Self-pay | Admitting: Emergency Medicine

## 2019-02-21 ENCOUNTER — Ambulatory Visit (HOSPITAL_COMMUNITY)
Admission: EM | Admit: 2019-02-21 | Discharge: 2019-02-21 | Disposition: A | Discharge status: Home / Self Care | Payer: Medicaid Other | Attending: Emergency Medicine | Admitting: Emergency Medicine

## 2019-02-21 DIAGNOSIS — J4520 Mild intermittent asthma, uncomplicated: Secondary | ICD-10-CM | POA: Diagnosis not present

## 2019-02-21 DIAGNOSIS — J069 Acute upper respiratory infection, unspecified: Secondary | ICD-10-CM

## 2019-02-21 MED ORDER — PREDNISONE 20 MG PO TABS: 40.0000 mg | ORAL_TABLET | Freq: Every day | ORAL | 0 refills | Status: AC

## 2019-02-21 MED ORDER — ALBUTEROL SULFATE HFA 108 (90 BASE) MCG/ACT IN AERS: 1.0000 | INHALATION_SPRAY | Freq: Four times a day (QID) | RESPIRATORY_TRACT | 0 refills | Status: DC | PRN

## 2019-02-21 NOTE — ED Provider Notes (Signed)
[place]    CSN: [id number] Arrival date & time: 02/21/19  1756      History   Chief Complaint Chief Complaint  Patient presents with  . Asthma    HPI Kathryn Norman is a 38 y.o. female.   Kathryn Norman presents with complaints of persistent cough causing her chest to feel "raw". Sometimes she will cough so hard she vomits. Hx of asthma, originally had ran out of inhaler. Went to ER 6/11 and was refilled, she states its minimally helping. No fevers. She does have sore throat and congestion. Present for two weeks but now with chest tightness. No fevers. No abdominal pain. No ear pain. No rash. She works at [employer], there have been covid cases where she works. No specific close contacts.    ROS per HPI, negative if not otherwise mentioned.      Past Medical History:  Diagnosis Date  . ADHD (attention deficit hyperactivity disorder)   . Anemia   . Anxiety and depression 06/19/2012  . Asthma   . Chlamydia infection 10/18/2014   Treated  . Depression   . Hemorrhagic ovarian cyst 09/07/2012  . Migraines     Patient Active Problem List   Diagnosis Date Noted  . Amenorrhea 12/08/2018  . Migraine with aura and with status migrainosus, not intractable 11/23/2018  . Bipolar disorder, unspecified ([place]) 11/23/2018  . Visual hallucination 11/23/2018  . Major depressive disorder 11/23/2018  . Pap smear of cervix on 10/18/2014 shows high risk HPV present 06/25/2018  . Anxiety and depression 06/19/2012  . Chronic headaches 06/19/2012    Past Surgical History:  Procedure Laterality Date  . CHOLECYSTECTOMY    . DILATION AND CURETTAGE OF UTERUS     x 2    OB History    Gravida  6   Para  3   Term  2   Preterm  1   AB  3   Living  3     SAB  3   TAB      Ectopic      Multiple      Live Births               Home Medications    Prior to Admission medications   Medication Sig Start Date End Date Taking? Authorizing Provider   albuterol (PROAIR HFA) 108 (90 Base) MCG/ACT inhaler Inhale 1-2 puffs into the lungs every 6 (six) hours as needed for wheezing or shortness of breath. 02/21/19   Augusto Gamble B, NP  ARIPiprazole (ABILIFY) 10 MG tablet Take 10 mg by mouth daily.    [provider]  atomoxetine (STRATTERA) 40 MG capsule Take 40 mg by mouth daily.    [provider]  benzonatate (TESSALON) 100 MG capsule Take 1 capsule (100 mg total) by mouth every 8 (eight) hours. Patient not taking: Reported on 06/25/2018 02/17/18   Domenic Moras, PA-C  Galcanezumab-gnlm [facility]) 120 MG/ML SOAJ Inject 240 mg into the skin as directed AND 120 mg every 30 (thirty) days. Inj 240mg  once then 120mg  monthly. 11/23/18   Jaclyn Prime, Collene Leyden, PA-C  ibuprofen (ADVIL,MOTRIN) 800 MG tablet Take 1 tablet (800 mg total) by mouth every 8 (eight) hours as needed. 11/23/18   Jaclyn Prime, Collene Leyden, PA-C  medroxyPROGESTERone (PROVERA) 10 MG tablet TAKE 1 TABLET (10 MG TOTAL) BY MOUTH DAILY. USE FOR TEN DAYS 11/22/18   Anyanwu, Sallyanne Havers, MD  megestrol (MEGACE) 40 MG tablet Take 1 tablet (40  mg total) by mouth 2 (two) times daily. 12/21/18   Anyanwu, Jethro BastosUgonna A, MD  metroNIDAZOLE (FLAGYL) 500 MG tablet Take two tablets by mouth twice a day, for one day.  Or you can take all four tablets at once if you can tolerate it. 10/20/18   Anyanwu, Jethro BastosUgonna A, MD  mirtazapine (REMERON) 15 MG tablet Take 15 mg by mouth at bedtime.    [provider]  predniSONE (DELTASONE) 20 MG tablet Take 2 tablets (40 mg total) by mouth daily with breakfast for 5 days. 02/21/19 02/26/19  Georgetta HaberBurky, Violette Morneault B, NP  SUMAtriptan (IMITREX) 100 MG tablet Take 1 tablet (100 mg total) by mouth once as needed for up to 1 dose for migraine. May repeat in 2 hours if headache persists or recurs. 11/23/18   Glyn Adeeague Clark, Scot JunKaren E, PA-C  traZODone (DESYREL) 50 MG tablet Take 50 mg by mouth at bedtime.    [provider]    Family History Family History  Problem Relation  Age of Onset  . Migraines Mother   . Alopecia Mother   . Sickle cell trait Mother   . Cancer Father        died of lung cancer  . Sickle cell anemia Sister   . Seizures Brother     Social History Social History   Tobacco Use  . Smoking status: Former Smoker    Packs/day: 0.10    Types: Cigarettes    Start date: 10/21/2011  . Smokeless tobacco: Never Used  Substance Use Topics  . Alcohol use: No    Alcohol/week: 0.0 standard drinks  . Drug use: No     Allergies   Acetaminophen and Red dye   Review of Systems Review of Systems   Physical Exam Triage Vital Signs ED Triage Vitals  Enc Vitals Group     BP 02/21/19 1824 (!) 140/100     Pulse Rate 02/21/19 1824 68     Resp 02/21/19 1824 16     Temp 02/21/19 1824 98.6 F (37 C)     Temp Source 02/21/19 1824 Oral     SpO2 02/21/19 1824 100 %     Weight --      Height --      Head Circumference --      Peak Flow --      Pain Score 02/21/19 1915 0     Pain Loc --      Pain Edu? --      Excl. in GC? --    No data found.  Updated Vital Signs BP (!) 140/100 (BP Location: Right Arm)   Pulse 68   Temp 98.6 F (37 C) (Oral)   Resp 16   SpO2 100%   Visual Acuity Right Eye Distance:   Left Eye Distance:   Bilateral Distance:    Right Eye Near:   Left Eye Near:    Bilateral Near:     Physical Exam Constitutional:      General: She is not in acute distress.    Appearance: She is well-developed.  Cardiovascular:     Rate and Rhythm: Normal rate and regular rhythm.  Pulmonary:     Effort: Pulmonary effort is normal.     Breath sounds: Normal breath sounds.     Comments: No wheezing on auscultation currently, no increased work of breathing; no cough during exam Skin:    General: Skin is warm and dry.  Neurological:     Mental Status: She is alert and  oriented to person, place, and time.      UC Treatments / Results  Labs (all labs ordered are listed, but only abnormal results are displayed) Labs  Reviewed - No data to display  EKG None  Radiology No results found.  Procedures Procedures (including critical care time)  Medications Ordered in UC Medications - No data to display  Initial Impression / Assessment and Plan / UC Course  I have reviewed the triage vital signs and the nursing notes.  Pertinent labs & imaging results that were available during my care of the patient were reviewed by me and considered in my medical decision making (see chart for details).    Afebrile. Non toxic in appearance. No increased work of breathing. refiled inhaler, prednisone provided. Does work in a warehouse environment close to others, recommended covid testing as well. Return precautions provided. Patient verbalized understanding and agreeable to plan.   Final Clinical Impressions(s) / UC Diagnoses   Final diagnoses:  Mild intermittent asthma, unspecified whether complicated  Viral upper respiratory tract infection     Discharge Instructions     Use of inhaler as needed for wheezing or shortness of breath.  Course of prednisone.  Due to your symptoms in presence of pandemic I do recommend Covid-19 testing. You will be called to set up an appointment time [facility]. Results take about 2-3 days.  Please self isolate until you receive this results.     ED Prescriptions    Medication Sig Dispense Auth. Provider   albuterol (PROAIR HFA) 108 (90 Base) MCG/ACT inhaler Inhale 1-2 puffs into the lungs every 6 (six) hours as needed for wheezing or shortness of breath. 8 g Linus MakoBurky, Jermani Pund B, NP   predniSONE (DELTASONE) 20 MG tablet Take 2 tablets (40 mg total) by mouth daily with breakfast for 5 days. 10 tablet Georgetta HaberBurky, Stanislaw Acton B, NP     Controlled Substance Prescriptions [place] Controlled Substance Registry consulted? Not Applicable   Georgetta HaberBurky, Linus Weckerly B, NP 02/21/19 2313

## 2019-02-21 NOTE — Discharge Instructions (Signed)
Use of inhaler as needed for wheezing or shortness of breath.  Course of prednisone.  Due to your symptoms in presence of pandemic I do recommend Covid-19 testing. You will be called to set up an appointment time to be tested at our [facility]. Results take about 2-3 days.  Please self isolate until you receive this results.

## 2019-02-21 NOTE — ED Triage Notes (Signed)
Per pt she has been having asthma issues for about 2 weeks. Feels like she just can't cough up anything. No chest pain no sob, no fevers

## 2019-02-22 ENCOUNTER — Telehealth: Payer: Self-pay | Admitting: *Deleted

## 2019-02-22 ENCOUNTER — Other Ambulatory Visit: Payer: Medicaid Other

## 2019-02-22 ENCOUNTER — Other Ambulatory Visit: Payer: Self-pay | Admitting: Physician Assistant

## 2019-02-22 DIAGNOSIS — Z20822 Contact with and (suspected) exposure to covid-19: Secondary | ICD-10-CM

## 2019-02-22 DIAGNOSIS — R6889 Other general symptoms and signs: Secondary | ICD-10-CM | POA: Diagnosis not present

## 2019-02-22 NOTE — Telephone Encounter (Signed)
Scheduled patient for COVID 19 testing today at 12:45 pm at [place]. Testing protocol reviewed.

## 2019-02-22 NOTE — Telephone Encounter (Signed)
-----   Message from Zigmund Gottron, NP sent at 02/21/2019  7:10 PM EDT ----- Regarding: covid testing needed

## 2019-02-25 LAB — NOVEL CORONAVIRUS, NAA: SARS-CoV-2, NAA: NOT DETECTED

## 2019-03-01 ENCOUNTER — Telehealth (HOSPITAL_COMMUNITY): Payer: Self-pay | Admitting: Emergency Medicine

## 2019-03-01 ENCOUNTER — Encounter (HOSPITAL_COMMUNITY): Payer: Self-pay

## 2019-03-01 ENCOUNTER — Encounter (HOSPITAL_COMMUNITY): Payer: Self-pay | Admitting: Emergency Medicine

## 2019-03-01 NOTE — Telephone Encounter (Signed)
Your test for COVID-19 was negative.  Please continue good preventive care measures, including:  frequent hand-washing, avoid touching your face, cover coughs/sneezes, stay out of crowds and keep a 6 foot distance from others.  If you develop fever/cough/breathlessness, please stay home for 10 days and until you have had 3 consecutive days with cough/breathlessness improving and without fever (without taking a fever reducer). Go to the nearest hospital ED tent for assessment if fever/cough/breathlessness are severe or illness seems like a threat to life.  Patient contacted and made aware of all results, all questions answered.  Sent work note in [employer]

## 2019-03-12 ENCOUNTER — Encounter (HOSPITAL_COMMUNITY): Payer: Self-pay

## 2019-03-12 ENCOUNTER — Ambulatory Visit (HOSPITAL_COMMUNITY)
Admission: EM | Admit: 2019-03-12 | Discharge: 2019-03-12 | Disposition: A | Discharge status: Home / Self Care | Payer: BC Managed Care – PPO | Attending: Internal Medicine | Admitting: Internal Medicine

## 2019-03-12 ENCOUNTER — Other Ambulatory Visit: Payer: Self-pay

## 2019-03-12 DIAGNOSIS — S335XXA Sprain of ligaments of lumbar spine, initial encounter: Secondary | ICD-10-CM | POA: Diagnosis not present

## 2019-03-12 MED ORDER — KETOROLAC TROMETHAMINE 60 MG/2ML IM SOLN
60.0000 mg | Freq: Once | INTRAMUSCULAR | Status: AC
  Administered 2019-03-12: 60 mg via INTRAMUSCULAR

## 2019-03-12 MED ORDER — KETOROLAC TROMETHAMINE 60 MG/2ML IM SOLN
INTRAMUSCULAR | Status: AC
  Filled 2019-03-12: qty 2

## 2019-03-12 MED ORDER — NAPROXEN 375 MG PO TABS: 375.0000 mg | ORAL_TABLET | Freq: Two times a day (BID) | ORAL | 0 refills | Status: DC

## 2019-03-12 MED ORDER — METHOCARBAMOL 500 MG PO TABS: 500.0000 mg | ORAL_TABLET | Freq: Two times a day (BID) | ORAL | 0 refills | Status: DC

## 2019-03-12 NOTE — ED Triage Notes (Signed)
Pt presents with right side hip pain after a fall a week ago.

## 2019-03-12 NOTE — ED Provider Notes (Signed)
[facility]    CSN: [id number] Arrival date & time: 03/12/19  1425     History   Chief Complaint Chief Complaint  Patient presents with  . Hip Pain    HPI Doug SouShelia D Vine is a 38 y.o. female with a history of ADHD comes to urgent care with complaint of right-sided hip pain which started 7 days ago.  Patient said she was walking, missed a step and then ended up falling.  She denied any loss of consciousness.  Patient started experiencing pain on the right hip area.  Pain is constant, throbbing and of moderate severity.  It is worsened with palpation.  Patient is able to ambulate.  Patient has tried over-the-counter medications (i.e. Motrin) with partial improvement.  No radiation of pain into the legs.  No numbness or tingling in the legs.  Patient has associated spasms in the lower back.  Patient is here to be evaluated. HPI  Past Medical History:  Diagnosis Date  . ADHD (attention deficit hyperactivity disorder)   . Anemia   . Anxiety and depression 06/19/2012  . Asthma   . Chlamydia infection 10/18/2014   Treated  . Depression   . Hemorrhagic ovarian cyst 09/07/2012  . Migraines     Patient Active Problem List   Diagnosis Date Noted  . Amenorrhea 12/08/2018  . Migraine with aura and with status migrainosus, not intractable 11/23/2018  . Bipolar disorder, unspecified (HCC) 11/23/2018  . Visual hallucination 11/23/2018  . Major depressive disorder 11/23/2018  . Pap smear of cervix on 10/18/2014 shows high risk HPV present 06/25/2018  . Anxiety and depression 06/19/2012  . Chronic headaches 06/19/2012    Past Surgical History:  Procedure Laterality Date  . CHOLECYSTECTOMY    . DILATION AND CURETTAGE OF UTERUS     x 2    OB History    Gravida  6   Para  3   Term  2   Preterm  1   AB  3   Living  3     SAB  3   TAB      Ectopic      Multiple      Live Births               Home Medications    Prior to Admission medications    Medication Sig Start Date End Date Taking? Authorizing Provider  albuterol (PROAIR HFA) 108 (90 Base) MCG/ACT inhaler Inhale 1-2 puffs into the lungs every 6 (six) hours as needed for wheezing or shortness of breath. 02/21/19   Linus MakoBurky, Natalie B, NP  ARIPiprazole (ABILIFY) 10 MG tablet Take 10 mg by mouth daily.    [provider]  atomoxetine (STRATTERA) 40 MG capsule Take 40 mg by mouth daily.    [provider]  Galcanezumab-gnlm (EMGALITY) 120 MG/ML SOAJ Inject 240 mg into the skin as directed AND 120 mg every 30 (thirty) days. Inj 240mg  once then 120mg  monthly. 11/23/18   Glyn Adeeague Clark, Scot JunKaren E, PA-C  ibuprofen (ADVIL,MOTRIN) 800 MG tablet Take 1 tablet (800 mg total) by mouth every 8 (eight) hours as needed. 11/23/18   Glyn Adeeague Clark, Scot JunKaren E, PA-C  medroxyPROGESTERone (PROVERA) 10 MG tablet TAKE 1 TABLET (10 MG TOTAL) BY MOUTH DAILY. USE FOR TEN DAYS 11/22/18   Anyanwu, Jethro BastosUgonna A, MD  megestrol (MEGACE) 40 MG tablet Take 1 tablet (40 mg total) by mouth 2 (two) times daily. 12/21/18   Anyanwu, Jethro BastosUgonna A, MD  methocarbamol (ROBAXIN) 500  MG tablet Take 1 tablet (500 mg total) by mouth 2 (two) times daily. 03/12/19   Chase Picket, MD  mirtazapine (REMERON) 15 MG tablet Take 15 mg by mouth at bedtime.    [provider]  naproxen (NAPROSYN) 375 MG tablet Take 1 tablet (375 mg total) by mouth 2 (two) times daily. 03/12/19   Lamptey, Myrene Galas, MD  SUMAtriptan (IMITREX) 100 MG tablet Take 1 tablet (100 mg total) by mouth once as needed for up to 1 dose for migraine. May repeat in 2 hours if headache persists or recurs. 11/23/18   Jaclyn Prime, Collene Leyden, PA-C  traZODone (DESYREL) 50 MG tablet Take 50 mg by mouth at bedtime.    [provider]    Family History Family History  Problem Relation Age of Onset  . Migraines Mother   . Alopecia Mother   . Sickle cell trait Mother   . Cancer Father        died of lung cancer  . Sickle cell anemia Sister   . Seizures Brother      Social History Social History   Tobacco Use  . Smoking status: Former Smoker    Packs/day: 0.10    Types: Cigarettes    Start date: 10/21/2011  . Smokeless tobacco: Never Used  Substance Use Topics  . Alcohol use: No    Alcohol/week: 0.0 standard drinks  . Drug use: No     Allergies   Acetaminophen and Red dye   Review of Systems Review of Systems  Constitutional: Positive for activity change. Negative for appetite change, chills and fatigue.  HENT: Negative.   Respiratory: Negative.   Gastrointestinal: Negative.  Negative for abdominal distention, abdominal pain, diarrhea, nausea and vomiting.  Genitourinary: Negative.   Musculoskeletal: Positive for arthralgias and gait problem. Negative for back pain, joint swelling and myalgias.  Skin: Negative for rash and wound.  Neurological: Negative for dizziness, weakness and light-headedness.     Physical Exam Triage Vital Signs ED Triage Vitals  Enc Vitals Group     BP 03/12/19 1506 129/84     Pulse Rate 03/12/19 1503 (!) 101     Resp 03/12/19 1503 18     Temp 03/12/19 1503 98.7 F (37.1 C)     Temp Source 03/12/19 1503 Oral     SpO2 03/12/19 1503 96 %     Weight --      Height --      Head Circumference --      Peak Flow --      Pain Score --      Pain Loc --      Pain Edu? --      Excl. in [place]? --    No data found.  Updated Vital Signs BP 129/84 (BP Location: Right Arm)   Pulse (!) 101   Temp 98.7 F (37.1 C) (Oral)   Resp 18   LMP 03/12/2019   SpO2 96%   Visual Acuity Right Eye Distance:   Left Eye Distance:   Bilateral Distance:    Right Eye Near:   Left Eye Near:    Bilateral Near:     Physical Exam Constitutional:      Appearance: She is not ill-appearing or toxic-appearing.  Cardiovascular:     Rate and Rhythm: Normal rate and regular rhythm.     Pulses: Normal pulses.     Heart sounds: Normal heart sounds.  Pulmonary:     Effort: Pulmonary effort is normal. No  respiratory  distress.     Breath sounds: Normal breath sounds. No wheezing or rhonchi.  Abdominal:     General: Bowel sounds are normal. There is no distension.     Palpations: Abdomen is soft.     Tenderness: There is no abdominal tenderness. There is no guarding.  Musculoskeletal: Normal range of motion.        General: No swelling, tenderness, deformity or signs of injury.  Skin:    General: Skin is warm.     Capillary Refill: Capillary refill takes less than 2 seconds.     Coloration: Skin is not pale.     Findings: No erythema, lesion or rash.  Neurological:     General: No focal deficit present.     Mental Status: She is alert and oriented to person, place, and time.      UC Treatments / Results  Labs (all labs ordered are listed, but only abnormal results are displayed) Labs Reviewed - No data to display  EKG   Radiology No results found.  Procedures Procedures (including critical care time)  Medications Ordered in UC Medications  ketorolac (TORADOL) injection 60 mg (60 mg Intramuscular Given 03/12/19 1542)  ketorolac (TORADOL) 60 MG/2ML injection (has no administration in time range)    Initial Impression / Assessment and Plan / UC Course  I have reviewed the triage vital signs and the nursing notes.  Pertinent labs & imaging results that were available during my care of the patient were reviewed by me and considered in my medical decision making (see chart for details).     1.  Right hip pain with lower back spasm: Robaxin as needed for muscle spasms Naproxen 375 mg twice daily Gentle range of motion exercises Warm compress over the lower back If patient's symptoms worsen patient may need to return to urgent care department for further evaluation. Final Clinical Impressions(s) / UC Diagnoses   Final diagnoses:  Sprain of low back, initial encounter   Discharge Instructions   None    ED Prescriptions    Medication Sig Dispense Auth. Provider   methocarbamol  (ROBAXIN) 500 MG tablet Take 1 tablet (500 mg total) by mouth 2 (two) times daily. 30 tablet Lamptey, Britta MccreedyPhilip O, MD   naproxen (NAPROSYN) 375 MG tablet Take 1 tablet (375 mg total) by mouth 2 (two) times daily. 20 tablet Lamptey, Britta MccreedyPhilip O, MD     Controlled Substance Prescriptions [place] Controlled Substance Registry consulted? No   Merrilee JanskyLamptey, Philip O, MD 03/15/19 1031

## 2019-03-23 ENCOUNTER — Ambulatory Visit (INDEPENDENT_AMBULATORY_CARE_PROVIDER_SITE_OTHER): Payer: BC Managed Care – PPO | Admitting: Advanced Practice Midwife

## 2019-03-23 ENCOUNTER — Other Ambulatory Visit: Payer: Self-pay

## 2019-03-23 VITALS — BP 136/94 | HR 79 | Ht 63.0 in | Wt 131.0 lb

## 2019-03-23 DIAGNOSIS — Z3202 Encounter for pregnancy test, result negative: Secondary | ICD-10-CM

## 2019-03-23 DIAGNOSIS — Z3009 Encounter for other general counseling and advice on contraception: Secondary | ICD-10-CM | POA: Diagnosis not present

## 2019-03-23 DIAGNOSIS — N939 Abnormal uterine and vaginal bleeding, unspecified: Secondary | ICD-10-CM | POA: Diagnosis not present

## 2019-03-23 DIAGNOSIS — Z114 Encounter for screening for human immunodeficiency virus [HIV]: Secondary | ICD-10-CM | POA: Diagnosis not present

## 2019-03-23 DIAGNOSIS — N3281 Overactive bladder: Secondary | ICD-10-CM

## 2019-03-23 LAB — POCT URINE PREGNANCY: Preg Test, Ur: NEGATIVE

## 2019-03-23 MED ORDER — TOLTERODINE TARTRATE ER 2 MG PO CP24: 2.0000 mg | ORAL_CAPSULE | Freq: Every day | ORAL | 12 refills | Status: DC

## 2019-03-23 NOTE — Progress Notes (Signed)
Pt states change in cycle this month.  Pt states that she has been regular the past few cycles and this month came a little later and very short.

## 2019-03-23 NOTE — Progress Notes (Signed)
GYNECOLOGY ANNUAL PREVENTATIVE CARE ENCOUNTER NOTE  History:     Kathryn Norman is a 38 y.o. [phone] female here for a gynecologic exam. Has been a  Current complaints:  1. Daily leakage of urine w/out precipitating stress. + frequency and urgency. Has been going on for years. Has been tested for UTI in the past for these Sx and was neg. Denies dysuria, hematuria. Very troubling to pt. Has to wear a pad.      2. Unprotected IC on multipel occassions last week. Last IC 03/20/19. Wants to start contraception. Does not want IUD, OCPs, patch, ring, or  Depo due to past experience. Interested in [place].   3. Irreg periods over the past year. Had n menses x 3 months last year followed by heavy, prolonged bleeding after Provera challenge. More normal cycles for several months then short 2-day cycle this month. [place] mildly elevated last year. TSH borderline low.   Denies abnormal vaginal bleeding, discharge, pelvic pain, problems with intercourse or other gynecologic concerns.    Gynecologic History Patient's last menstrual period was 03/12/2019. Contraception: none Last Pap: 05/2018. Results were: normal with negative HPV. Hx Pos HR HPV 2016.  Last mammogram: None. Results were: NA  Obstetric History OB History  Gravida Para Term Preterm AB Living  6 3 2 1 3 3   SAB TAB Ectopic Multiple Live Births  3            # Outcome Date GA Lbr Len/2nd Weight Sex Delivery Anes PTL Lv  6 SAB           5 SAB           4 SAB           3 Preterm           2 Term           1 Term             Past Medical History:  Diagnosis Date  . ADHD (attention deficit hyperactivity disorder)   . Anemia   . Anxiety and depression 06/19/2012  . Asthma   . Chlamydia infection 10/18/2014   Treated  . Depression   . Hemorrhagic ovarian cyst 09/07/2012  . Migraines     Past Surgical History:  Procedure Laterality Date  . CHOLECYSTECTOMY    . DILATION AND CURETTAGE OF UTERUS     x 2    Current  Outpatient Medications on File Prior to Visit  Medication Sig Dispense Refill  . albuterol (PROAIR HFA) 108 (90 Base) MCG/ACT inhaler Inhale 1-2 puffs into the lungs every 6 (six) hours as needed for wheezing or shortness of breath. 8 g 0  . Galcanezumab-gnlm (EMGALITY) 120 MG/ML SOAJ Inject 240 mg into the skin as directed AND 120 mg every 30 (thirty) days. Inj 240mg  once then 120mg  monthly. 1 pen 2  . ibuprofen (ADVIL,MOTRIN) 800 MG tablet Take 1 tablet (800 mg total) by mouth every 8 (eight) hours as needed. 30 tablet 1  . SUMAtriptan (IMITREX) 100 MG tablet Take 1 tablet (100 mg total) by mouth once as needed for up to 1 dose for migraine. May repeat in 2 hours if headache persists or recurs. 9 tablet 2  . ARIPiprazole (ABILIFY) 10 MG tablet Take 10 mg by mouth daily.    Marland Kitchen atomoxetine (STRATTERA) 40 MG capsule Take 40 mg by mouth daily.    . medroxyPROGESTERone (PROVERA) 10 MG tablet TAKE 1 TABLET (10 MG TOTAL) BY  MOUTH DAILY. USE FOR TEN DAYS (Patient not taking: Reported on 03/23/2019) 10 tablet 2  . megestrol (MEGACE) 40 MG tablet Take 1 tablet (40 mg total) by mouth 2 (two) times daily. (Patient not taking: Reported on 03/23/2019) 30 tablet 3  . methocarbamol (ROBAXIN) 500 MG tablet Take 1 tablet (500 mg total) by mouth 2 (two) times daily. (Patient not taking: Reported on 03/23/2019) 30 tablet 0  . mirtazapine (REMERON) 15 MG tablet Take 15 mg by mouth at bedtime.    . naproxen (NAPROSYN) 375 MG tablet Take 1 tablet (375 mg total) by mouth 2 (two) times daily. 20 tablet 0  . traZODone (DESYREL) 50 MG tablet Take 50 mg by mouth at bedtime.     No current facility-administered medications on file prior to visit.     Allergies  Allergen Reactions  . Acetaminophen Nausea And Vomiting  . Red Dye Rash    Social History:  reports that she has quit smoking. Her smoking use included cigarettes. She started smoking about 7 years ago. She smoked 0.10 packs per day. She has never used smokeless  tobacco. She reports that she does not drink alcohol or use drugs.  Family History  Problem Relation Age of Onset  . Migraines Mother   . Alopecia Mother   . Sickle cell trait Mother   . Cancer Father        died of lung cancer  . Sickle cell anemia Sister   . Seizures Brother     The following portions of the patient's history were reviewed and updated as appropriate: allergies, current medications, past family history, past medical history, past social history, past surgical history and problem list.  Review of Systems Review of Systems  Constitutional: Positive for unexpected weight change (wt loss/difficulty maintaining wt). Negative for chills and fever.  Gastrointestinal: Negative for abdominal pain, nausea and vomiting.  Endocrine: Negative for cold intolerance, heat intolerance, polydipsia and polyuria.       Neg for hot flashes  Genitourinary: Positive for frequency, menstrual problem and urgency. Negative for difficulty urinating, dyspareunia, dysuria, flank pain, hematuria, pelvic pain, vaginal bleeding, vaginal discharge and vaginal pain.       Involuntary leaking of urine  Skin:       Pos for hair loss      Physical Exam:  BP (!) 136/94   Pulse 79   Ht 5\' 3"  (1.6 m)   Wt 131 lb (59.4 kg)   LMP 03/12/2019   BMI 23.21 kg/m  CONSTITUTIONAL: Well-developed, slender female in no acute distress.  HENT:  Normocephalic, atraumatic, Oropharynx is clear and moist EYES: Conjunctivae normal. No scleral icterus.  NECK: Normal range of motion, supple, no masses.  Normal thyroid.  SKIN: Skin is warm and dry. No rash noted. Not diaphoretic. No erythema. No pallor. MUSCULOSKELETAL: Normal range of motion. No tenderness.  No cyanosis, clubbing, or edema.   NEUROLOGIC: Alert and oriented to person, place, and time. Normal muscle tone coordination.  PSYCHIATRIC: Normal mood and affect. Normal behavior. Normal judgment and thought content. CARDIOVASCULAR: Normal regular rhythm  RESPIRATORY: Effort normal, no problems with respiration noted. BREASTS: Symmetric in size. No masses, skin changes, nipple drainage, or lymphadenopathy. ABDOMEN: Soft, no distention noted.  No tenderness, rebound or guarding.  PELVIC: Declined  Results for orders placed or performed in visit on 03/23/19 (from the past 24 hour(s))  POCT urine pregnancy     Status: None   Collection Time: 03/23/19  2:28 PM  Result Value  Ref Range   Preg Test, Ur Negative Negative      Assessment and Plan:  1. Abnormal uterine bleeding (AUB)  - FSH - TSH - POCT urine pregnancy - Prolactin  2. Overactive bladder - UA - tolterodine (DETROL LA) 2 MG 24 hr capsule; Take 1 capsule (2 mg total) by mouth daily.  Dispense: 60 capsule; Refill: 12  3. Birth control counseling  - POCT urine pregnancy - Plans to return for Nexplanon. Explain possibility of irreg bleeding on Nexplanon. Pt verbalizes understanding and still requests it.   Will follow up results of pap smear and manage accordingly. Pap due 2022 (due to Hx HRHPV 2016 followed by Neg Pap/HPV 2019) Routine preventative health maintenance measures emphasized. Please refer to After Visit Summary for other counseling recommendations.    No IC or consistent condom use until Nexplanon insertion  [place] Lionel Wood[place], [facility], [facility]

## 2019-03-23 NOTE — Patient Instructions (Signed)
Etonogestrel implant What is this medicine? ETONOGESTREL (et oh noe JES trel) is a contraceptive (birth control) device. It is used to prevent pregnancy. It can be used for up to 3 years. This medicine may be used for other purposes; ask your health care provider or pharmacist if you have questions. COMMON BRAND NAME(S): Implanon, Nexplanon What should I tell my health care provider before I take this medicine? They need to know if you have any of these conditions:  abnormal vaginal bleeding  blood vessel disease or blood clots  breast, cervical, endometrial, ovarian, liver, or uterine cancer  diabetes  gallbladder disease  heart disease or recent heart attack  high blood pressure  high cholesterol or triglycerides  kidney disease  liver disease  migraine headaches  seizures  stroke  tobacco smoker  an unusual or allergic reaction to etonogestrel, anesthetics or antiseptics, other medicines, foods, dyes, or preservatives  pregnant or trying to get pregnant  breast-feeding How should I use this medicine? This device is inserted just under the skin on the inner side of your upper arm by a health care professional. Talk to your pediatrician regarding the use of this medicine in children. Special care may be needed. Overdosage: If you think you have taken too much of this medicine contact a poison control center or emergency room at once. NOTE: This medicine is only for you. Do not share this medicine with others. What if I miss a dose? This does not apply. What may interact with this medicine? Do not take this medicine with any of the following medications:  amprenavir  fosamprenavir This medicine may also interact with the following medications:  acitretin  aprepitant  armodafinil  bexarotene  bosentan  carbamazepine  certain medicines for fungal infections like fluconazole, ketoconazole, itraconazole and voriconazole  certain medicines to treat  hepatitis, HIV or AIDS  cyclosporine  felbamate  griseofulvin  lamotrigine  modafinil  oxcarbazepine  phenobarbital  phenytoin  primidone  rifabutin  rifampin  rifapentine  St. John's wort  topiramate This list may not describe all possible interactions. Give your health care provider a list of all the medicines, herbs, non-prescription drugs, or dietary supplements you use. Also tell them if you smoke, drink alcohol, or use illegal drugs. Some items may interact with your medicine. What should I watch for while using this medicine? This product does not protect you against HIV infection (AIDS) or other sexually transmitted diseases. You should be able to feel the implant by pressing your fingertips over the skin where it was inserted. Contact your doctor if you cannot feel the implant, and use a non-hormonal birth control method (such as condoms) until your doctor confirms that the implant is in place. Contact your doctor if you think that the implant may have broken or become bent while in your arm. You will receive a user card from your health care provider after the implant is inserted. The card is a record of the location of the implant in your upper arm and when it should be removed. Keep this card with your health records. What side effects may I notice from receiving this medicine? Side effects that you should report to your doctor or health care professional as soon as possible:  allergic reactions like skin rash, itching or hives, swelling of the face, lips, or tongue  breast lumps, breast tissue changes, or discharge  breathing problems  changes in emotions or moods  if you feel that the implant may have broken or   bent while in your arm  high blood pressure  pain, irritation, swelling, or bruising at the insertion site  scar at site of insertion  signs of infection at the insertion site such as fever, and skin redness, pain or discharge  signs and  symptoms of a blood clot such as breathing problems; changes in vision; chest pain; severe, sudden headache; pain, swelling, warmth in the leg; trouble speaking; sudden numbness or weakness of the face, arm or leg  signs and symptoms of liver injury like dark yellow or brown urine; general ill feeling or flu-like symptoms; light-colored stools; loss of appetite; nausea; right upper belly pain; unusually weak or tired; yellowing of the eyes or skin  unusual vaginal bleeding, discharge Side effects that usually do not require medical attention (report to your doctor or health care professional if they continue or are bothersome):  acne  breast pain or tenderness  headache  irregular menstrual bleeding  nausea This list may not describe all possible side effects. Call your doctor for medical advice about side effects. You may report side effects to FDA at 1-800-FDA-1088. Where should I keep my medicine? This drug is given in a hospital or clinic and will not be stored at home. NOTE: This sheet is a summary. It may not cover all possible information. If you have questions about this medicine, talk to your doctor, pharmacist, or health care provider.  2020 Elsevier/Gold Standard (2017-07-01 14:11:42)  

## 2019-03-24 LAB — FOLLICLE STIMULATING HORMONE: FSH: 17.3 m[IU]/mL

## 2019-03-24 LAB — TSH: TSH: 0.492 u[IU]/mL (ref 0.450–4.500)

## 2019-03-24 LAB — PROLACTIN: Prolactin: 7.9 ng/mL (ref 4.8–23.3)

## 2019-03-31 ENCOUNTER — Ambulatory Visit (HOSPITAL_COMMUNITY)
Admission: EM | Admit: 2019-03-31 | Discharge: 2019-03-31 | Disposition: A | Discharge status: Home / Self Care | Payer: BC Managed Care – PPO | Attending: Emergency Medicine | Admitting: Emergency Medicine

## 2019-03-31 ENCOUNTER — Other Ambulatory Visit: Payer: Self-pay

## 2019-03-31 ENCOUNTER — Encounter (HOSPITAL_COMMUNITY): Payer: Self-pay

## 2019-03-31 ENCOUNTER — Other Ambulatory Visit: Payer: Self-pay | Admitting: Physician Assistant

## 2019-03-31 ENCOUNTER — Ambulatory Visit (INDEPENDENT_AMBULATORY_CARE_PROVIDER_SITE_OTHER): Payer: BC Managed Care – PPO

## 2019-03-31 DIAGNOSIS — R05 Cough: Secondary | ICD-10-CM | POA: Diagnosis not present

## 2019-03-31 DIAGNOSIS — J4541 Moderate persistent asthma with (acute) exacerbation: Secondary | ICD-10-CM

## 2019-03-31 DIAGNOSIS — J4520 Mild intermittent asthma, uncomplicated: Secondary | ICD-10-CM | POA: Diagnosis not present

## 2019-03-31 MED ORDER — BECLOMETHASONE DIPROPIONATE 40 MCG/ACT IN AERS: 1.0000 | INHALATION_SPRAY | Freq: Two times a day (BID) | RESPIRATORY_TRACT | 12 refills | Status: DC

## 2019-03-31 MED ORDER — ALBUTEROL SULFATE (2.5 MG/3ML) 0.083% IN NEBU: 2.5000 mg | INHALATION_SOLUTION | Freq: Four times a day (QID) | RESPIRATORY_TRACT | 0 refills | Status: AC | PRN

## 2019-03-31 MED ORDER — CETIRIZINE HCL 10 MG PO TABS: 10.0000 mg | ORAL_TABLET | Freq: Every day | ORAL | 0 refills | Status: DC

## 2019-03-31 MED ORDER — PREDNISONE 20 MG PO TABS: 40.0000 mg | ORAL_TABLET | Freq: Every day | ORAL | 0 refills | Status: AC

## 2019-03-31 NOTE — ED Provider Notes (Signed)
[facility]    CSN: [id number] Arrival date & time: 03/31/19  1244     History   Chief Complaint Chief Complaint  Patient presents with  . Cough    HPI Kathryn Norman is a 38 y.o. female.   Kathryn Norman presents with complaints of cough for the past two weeks. Feels shortness of breath. Nasal congestion. Cough is productive of phlegm. Hx of asthma, has been using inhaler which has minimally helped. Heat worsens the cough. Doesn't smoke. Post cough emesis at times. No known fevers. Has been taking dayquil and nyquil. Has been fatigued. Chest feels pressure. No heart palpitations. No nausea or diarrhea. No known ill contacts. No loss of taste or smell. Decreased appetite. Mild left ear pain. No sore throat. No leg pain or swelling. No recent travel. Works at [employer]. Denies history of allergies.  Didn't go in to work yesterday. Off of birth control currently. Just started her period.     ROS per HPI, negative if not otherwise mentioned.      Past Medical History:  Diagnosis Date  . ADHD (attention deficit hyperactivity disorder)   . Anemia   . Anxiety and depression 06/19/2012  . Asthma   . Chlamydia infection 10/18/2014   Treated  . Depression   . Hemorrhagic ovarian cyst 09/07/2012  . Migraines     Patient Active Problem List   Diagnosis Date Noted  . Amenorrhea 12/08/2018  . Migraine with aura and with status migrainosus, not intractable 11/23/2018  . Bipolar disorder, unspecified (HCC) 11/23/2018  . Visual hallucination 11/23/2018  . Major depressive disorder 11/23/2018  . Pap smear of cervix on 10/18/2014 shows high risk HPV present 06/25/2018  . Anxiety and depression 06/19/2012  . Chronic headaches 06/19/2012    Past Surgical History:  Procedure Laterality Date  . CHOLECYSTECTOMY    . DILATION AND CURETTAGE OF UTERUS     x 2    OB History    Gravida  6   Para  3   Term  2   Preterm  1   AB  3   Living  3     SAB  3   TAB      Ectopic      Multiple      Live Births               Home Medications    Prior to Admission medications   Medication Sig Start Date End Date Taking? Authorizing Provider  albuterol (PROAIR HFA) 108 (90 Base) MCG/ACT inhaler Inhale 1-2 puffs into the lungs every 6 (six) hours as needed for wheezing or shortness of breath. 02/21/19   Linus MakoBurky, Mylee Falin B, NP  albuterol (PROVENTIL) (2.5 MG/3ML) 0.083% nebulizer solution Take 3 mLs (2.5 mg total) by nebulization every 6 (six) hours as needed for wheezing or shortness of breath. 03/31/19   Linus MakoBurky, Lajean Boese B, NP  ARIPiprazole (ABILIFY) 10 MG tablet Take 10 mg by mouth daily.    [provider]  atomoxetine (STRATTERA) 40 MG capsule Take 40 mg by mouth daily.    [provider]  beclomethasone (QVAR) 40 MCG/ACT inhaler Inhale 1 puff into the lungs 2 (two) times daily before a meal. 03/31/19   Jamayia Croker, Barron AlvineNatalie B, NP  cetirizine (ZYRTEC) 10 MG tablet Take 1 tablet (10 mg total) by mouth daily. 03/31/19   Georgetta HaberBurky, Chavonne Sforza B, NP  Galcanezumab-gnlm (EMGALITY) 120 MG/ML SOAJ Inject 240 mg into the skin as directed AND 120 mg  every 30 (thirty) days. Inj 240mg  once then 120mg  monthly. 11/23/18   Glyn Adeeague Clark, Scot JunKaren E, PA-C  ibuprofen (ADVIL,MOTRIN) 800 MG tablet Take 1 tablet (800 mg total) by mouth every 8 (eight) hours as needed. 11/23/18   Glyn Adeeague Clark, Scot JunKaren E, PA-C  medroxyPROGESTERone (PROVERA) 10 MG tablet TAKE 1 TABLET (10 MG TOTAL) BY MOUTH DAILY. USE FOR TEN DAYS Patient not taking: Reported on 03/23/2019 11/22/18   Anyanwu, Jethro BastosUgonna A, MD  megestrol (MEGACE) 40 MG tablet Take 1 tablet (40 mg total) by mouth 2 (two) times daily. Patient not taking: Reported on 03/23/2019 12/21/18   Anyanwu, Jethro BastosUgonna A, MD  methocarbamol (ROBAXIN) 500 MG tablet Take 1 tablet (500 mg total) by mouth 2 (two) times daily. Patient not taking: Reported on 03/23/2019 03/12/19   Merrilee JanskyLamptey, Philip O, MD  mirtazapine (REMERON) 15 MG tablet Take 15 mg by mouth at bedtime.     [provider]  naproxen (NAPROSYN) 375 MG tablet Take 1 tablet (375 mg total) by mouth 2 (two) times daily. 03/12/19   Lamptey, Britta MccreedyPhilip O, MD  predniSONE (DELTASONE) 20 MG tablet Take 2 tablets (40 mg total) by mouth daily with breakfast for 5 days. 03/31/19 04/05/19  Georgetta HaberBurky, Lavayah Vita B, NP  SUMAtriptan (IMITREX) 100 MG tablet Take 1 tablet (100 mg total) by mouth once as needed for up to 1 dose for migraine. May repeat in 2 hours if headache persists or recurs. 11/23/18   Glyn Adeeague Clark, Scot JunKaren E, PA-C  tolterodine (DETROL LA) 2 MG 24 hr capsule Take 1 capsule (2 mg total) by mouth daily. 03/23/19   Katrinka BlazingSmith, [place], CNM  traZODone (DESYREL) 50 MG tablet Take 50 mg by mouth at bedtime.    [provider]    Family History Family History  Problem Relation Age of Onset  . Migraines Mother   . Alopecia Mother   . Sickle cell trait Mother   . Cancer Father        died of lung cancer  . Sickle cell anemia Sister   . Seizures Brother     Social History Social History   Tobacco Use  . Smoking status: Former Smoker    Packs/day: 0.10    Types: Cigarettes    Start date: 10/21/2011  . Smokeless tobacco: Never Used  Substance Use Topics  . Alcohol use: No    Alcohol/week: 0.0 standard drinks  . Drug use: No     Allergies   Acetaminophen and Red dye   Review of Systems Review of Systems   Physical Exam Triage Vital Signs ED Triage Vitals  Enc Vitals Group     BP 03/31/19 1321 117/79     Pulse Rate 03/31/19 1321 76     Resp 03/31/19 1321 16     Temp 03/31/19 1321 98.5 F (36.9 C)     Temp Source 03/31/19 1321 Oral     SpO2 03/31/19 1321 100 %     Weight 03/31/19 1319 130 lb (59 kg)     Height --      Head Circumference --      Peak Flow --      Pain Score 03/31/19 1319 5     Pain Loc --      Pain Edu? --      Excl. in GC? --    No data found.  Updated Vital Signs BP 117/79 (BP Location: Right Arm)   Pulse 76   Temp 98.5 F (36.9 C) (Oral)   Resp 16  Wt 130 lb (59 kg)   LMP 03/12/2019   SpO2 100%   BMI 23.03 kg/m   Visual Acuity Right Eye Distance:   Left Eye Distance:   Bilateral Distance:    Right Eye Near:   Left Eye Near:    Bilateral Near:     Physical Exam Constitutional:      General: She is not in acute distress.    Appearance: She is well-developed.  HENT:     Right Ear: External ear normal.     Left Ear: External ear normal.     Nose: Nose normal.  Cardiovascular:     Rate and Rhythm: Normal rate and regular rhythm.     Heart sounds: Normal heart sounds.  Pulmonary:     Effort: Pulmonary effort is normal. No respiratory distress.     Breath sounds: Normal breath sounds. No rhonchi or rales.  Skin:    General: Skin is warm and dry.  Neurological:     Mental Status: She is alert and oriented to person, place, and time.      UC Treatments / Results  Labs (all labs ordered are listed, but only abnormal results are displayed) Labs Reviewed  NOVEL CORONAVIRUS, NAA (HOSPITAL ORDER, SEND-OUT TO REF LAB)    EKG   Radiology Dg Chest 2 View  Result Date: 03/31/2019 CLINICAL DATA:  Persistent cough EXAM: CHEST - 2 VIEW COMPARISON:  02/17/2018 FINDINGS: Heart size and vascularity normal. Interval development of small bilateral pleural effusions. Negative for edema. Negative for pneumonia. No focal consolidation. IMPRESSION: Interval development of small bilateral pleural effusions without evidence of heart failure or pneumonia. Electronically Signed   By: Franchot Gallo M.D.   On: 03/31/2019 14:03    Procedures Procedures (including critical care time)  Medications Ordered in UC Medications - No data to display  Initial Impression / Assessment and Plan / UC Course  I have reviewed the triage vital signs and the nursing notes.  Pertinent labs & imaging results that were available during my care of the patient were reviewed by me and considered in my medical decision making (see chart for details).      Negative covid testing recently after seen here 6/28, same symptoms. Patient left clinic before repeat testing. Appears consistent with unmanaged asthma, supported by xray. qvar initiated. Course of prednisone provided. Will start on daily zyrtec as well. Encouraged establish care with pcp for recheck and management of asthma. Return precautions provided. Patient verbalized understanding and agreeable to plan.   Final Clinical Impressions(s) / UC Diagnoses   Final diagnoses:  Moderate persistent asthma with exacerbation     Discharge Instructions     Please start twice a day maintenance inhaler. This is to be used regularly.  Continue to use your albuterol inhaler as needed.  5 days of prednisone.  Use of nebulizer may replace inhaler as needed.  Please follow up with our internal medicine clinic for further evaluation and management.     ED Prescriptions    Medication Sig Dispense Auth. Provider   beclomethasone (QVAR) 40 MCG/ACT inhaler Inhale 1 puff into the lungs 2 (two) times daily before a meal. 1 Inhaler Beata Beason B, NP   albuterol (PROVENTIL) (2.5 MG/3ML) 0.083% nebulizer solution Take 3 mLs (2.5 mg total) by nebulization every 6 (six) hours as needed for wheezing or shortness of breath. 75 mL Augusto Gamble B, NP   cetirizine (ZYRTEC) 10 MG tablet Take 1 tablet (10 mg total) by mouth daily. Coco  tablet Linus MakoBurky, Kayleanna Lorman B, NP   predniSONE (DELTASONE) 20 MG tablet Take 2 tablets (40 mg total) by mouth daily with breakfast for 5 days. 10 tablet Georgetta HaberBurky, Cynthia Cogle B, NP     Controlled Substance Prescriptions [place] Controlled Substance Registry consulted? Not Applicable   Georgetta HaberBurky, Reem Fleury B, NP 03/31/19 [phone]

## 2019-03-31 NOTE — ED Triage Notes (Signed)
Pt states she has been cough x 2 weeks. Pt state she has a SOB. Pt states she takes Dayquil and Nyquil and it's not working.

## 2019-03-31 NOTE — Discharge Instructions (Addendum)
Please start twice a day maintenance inhaler. This is to be used regularly.  Continue to use your albuterol inhaler as needed.  5 days of prednisone.  Use of nebulizer may replace inhaler as needed.  Please follow up with our internal medicine clinic for further evaluation and management.

## 2019-04-02 ENCOUNTER — Telehealth (HOSPITAL_COMMUNITY): Payer: Self-pay | Admitting: Family Medicine

## 2019-04-02 MED ORDER — QVAR REDIHALER 80 MCG/ACT IN AERB: 1.0000 | INHALATION_SPRAY | Freq: Two times a day (BID) | RESPIRATORY_TRACT | 1 refills | Status: AC

## 2019-04-02 NOTE — Telephone Encounter (Signed)
Med refill

## 2019-04-13 ENCOUNTER — Ambulatory Visit: Payer: BC Managed Care – PPO | Admitting: Medical

## 2019-04-20 ENCOUNTER — Telehealth: Payer: Self-pay | Admitting: Medical

## 2019-04-22 DIAGNOSIS — J45909 Unspecified asthma, uncomplicated: Secondary | ICD-10-CM | POA: Diagnosis not present

## 2019-04-22 DIAGNOSIS — M549 Dorsalgia, unspecified: Secondary | ICD-10-CM | POA: Diagnosis not present

## 2019-04-22 DIAGNOSIS — G43909 Migraine, unspecified, not intractable, without status migrainosus: Secondary | ICD-10-CM | POA: Diagnosis not present

## 2019-04-22 DIAGNOSIS — Z1331 Encounter for screening for depression: Secondary | ICD-10-CM | POA: Diagnosis not present

## 2019-04-22 DIAGNOSIS — N3941 Urge incontinence: Secondary | ICD-10-CM | POA: Diagnosis not present

## 2019-05-05 DIAGNOSIS — R05 Cough: Secondary | ICD-10-CM | POA: Diagnosis not present

## 2019-05-05 DIAGNOSIS — J45909 Unspecified asthma, uncomplicated: Secondary | ICD-10-CM | POA: Diagnosis not present

## 2019-05-10 ENCOUNTER — Encounter: Payer: Self-pay | Admitting: *Deleted

## 2019-05-12 ENCOUNTER — Other Ambulatory Visit: Payer: Self-pay | Admitting: Advanced Practice Midwife

## 2019-05-12 DIAGNOSIS — N3281 Overactive bladder: Secondary | ICD-10-CM

## 2019-05-15 IMAGING — CR DG CHEST 2V
2 series · 2 of 2 positions shown · non-contrast
Comparison: 09/10/2014.

CLINICAL DATA: Cough, shortness of breath and wheezing, worsening
over the last 2 weeks.

EXAM:
CHEST - 2 VIEW

[w chest pa]
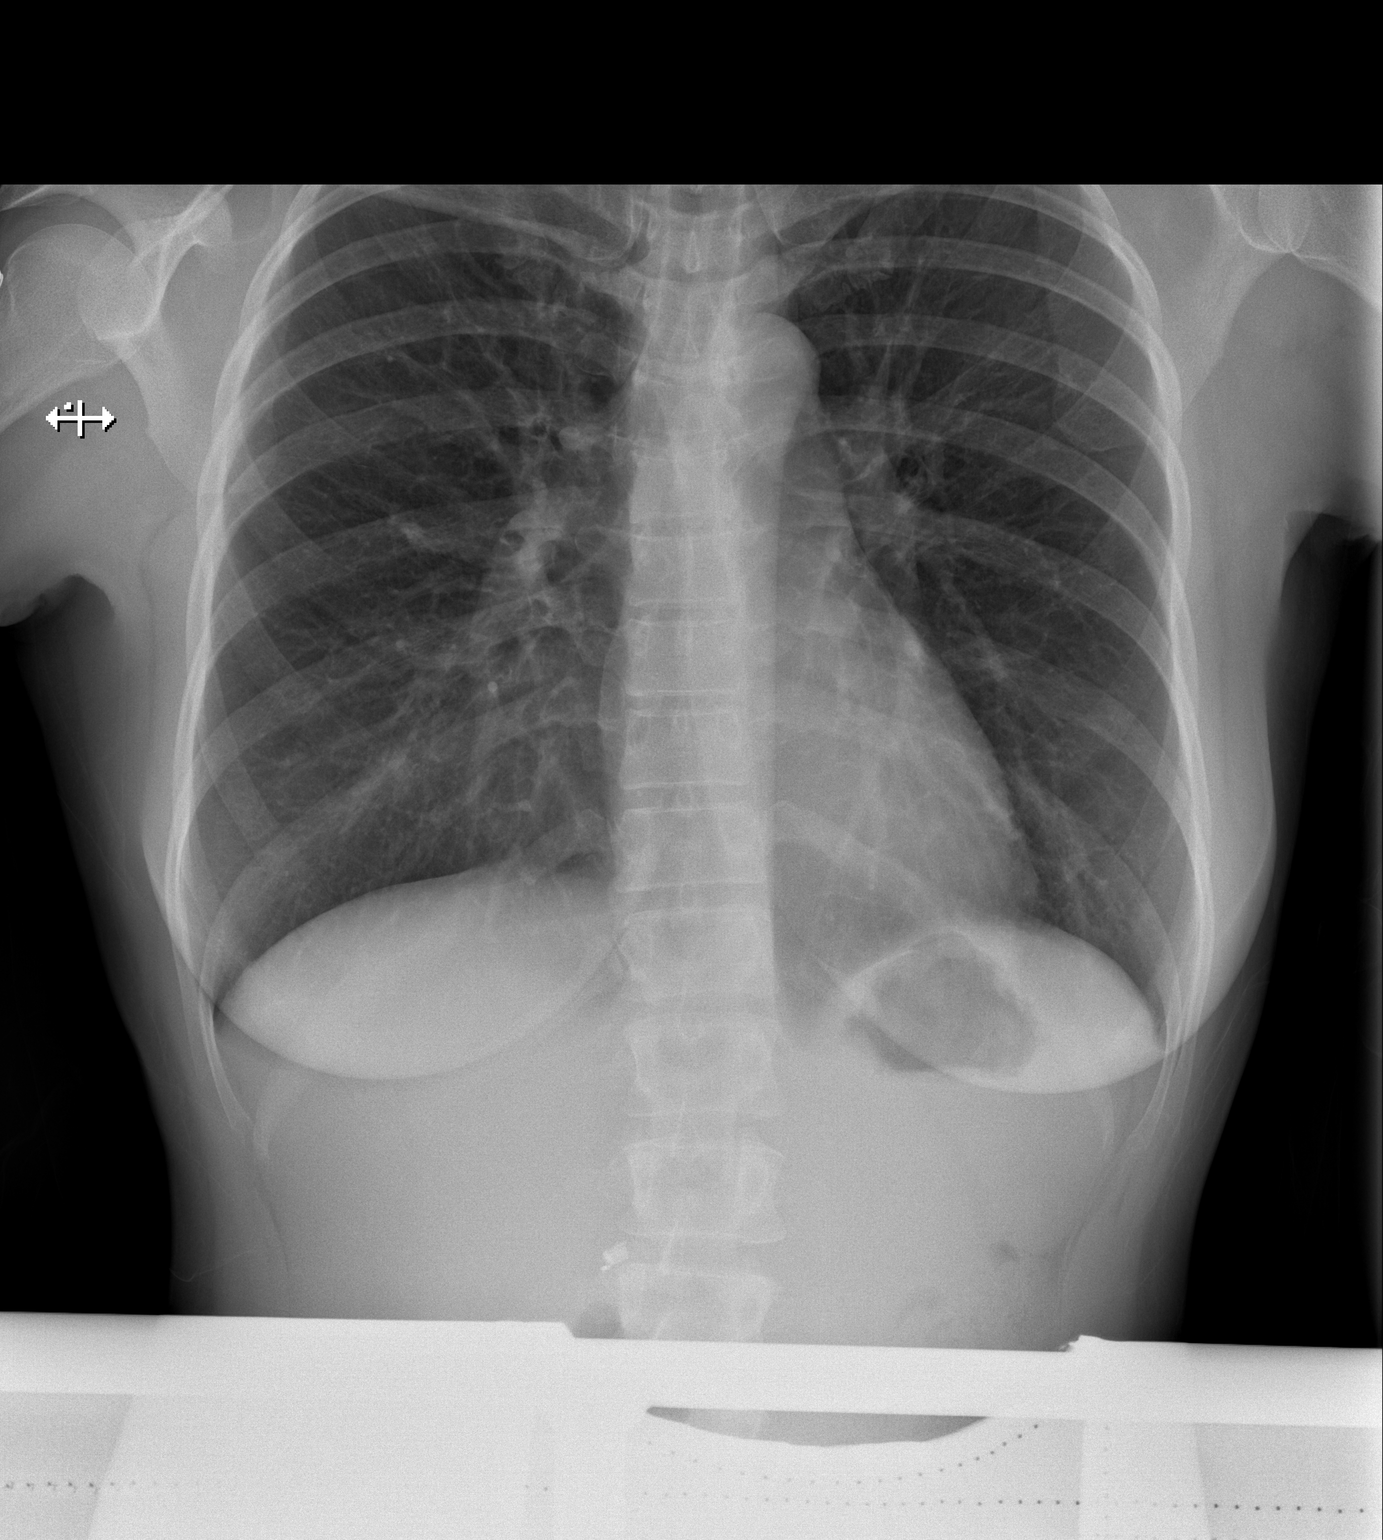

[w chest lat]
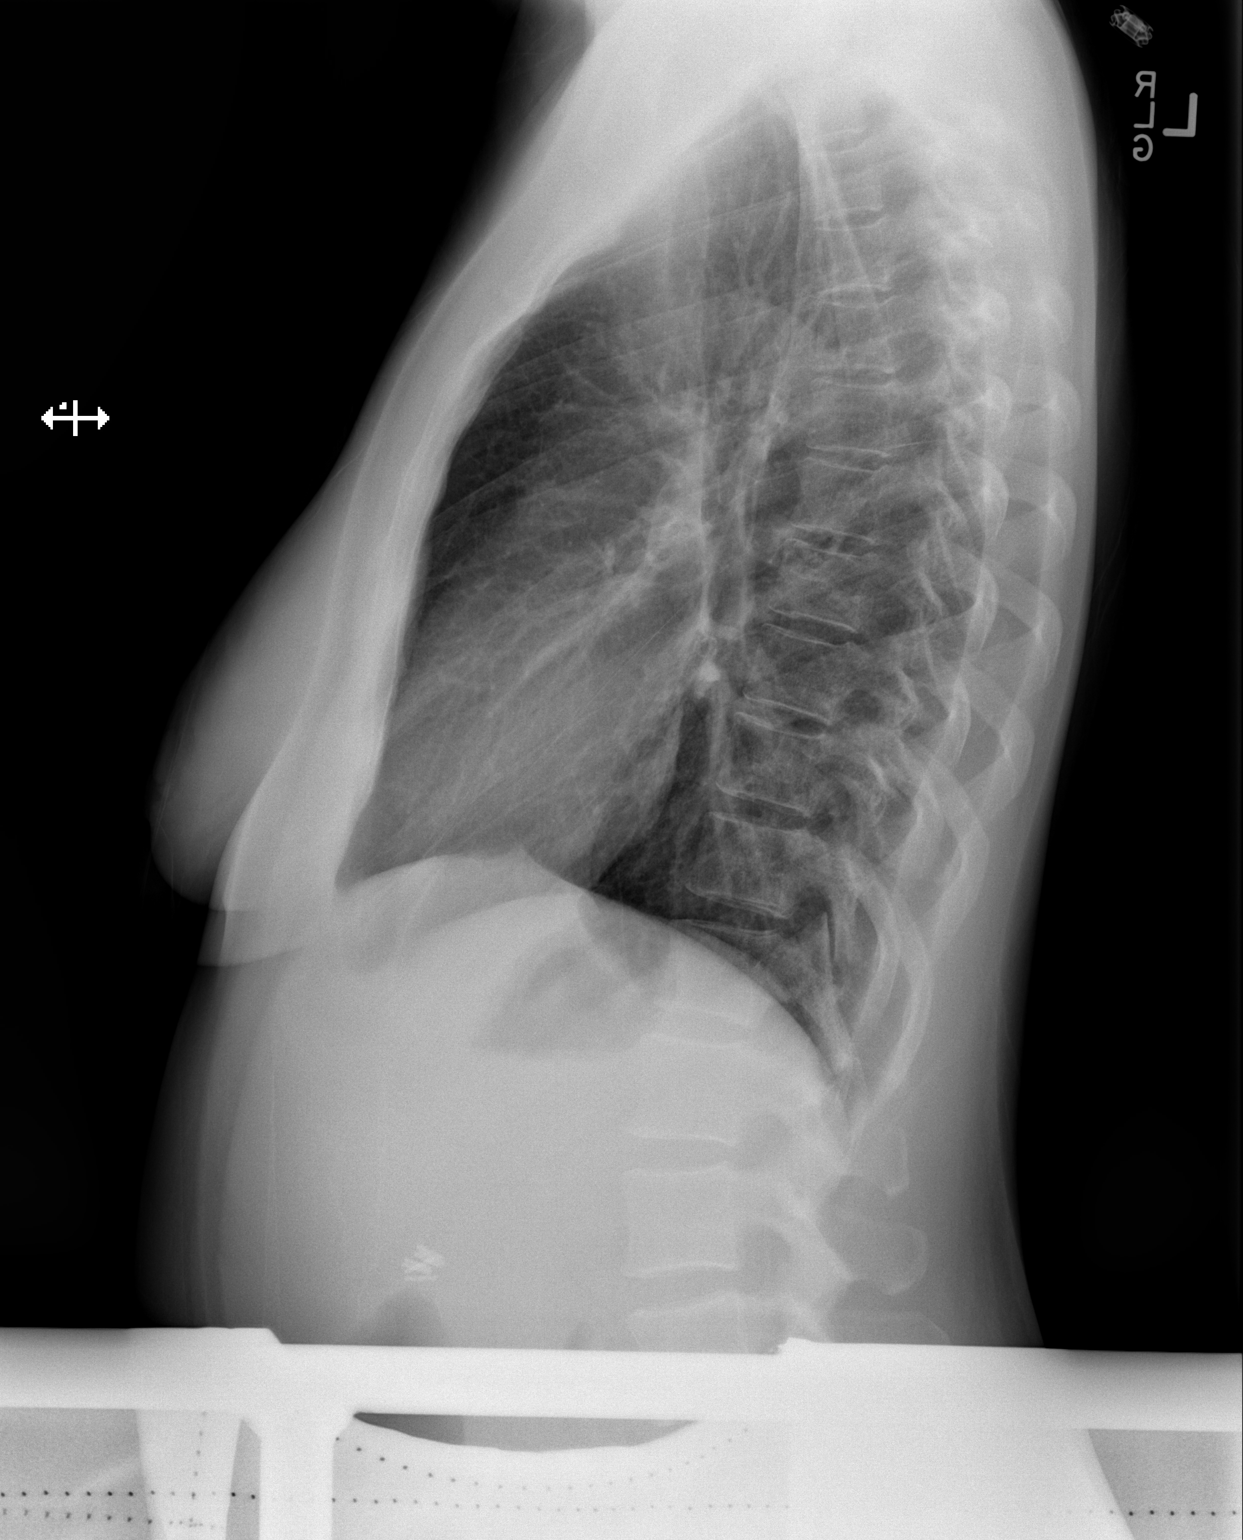

[2 of 2 positions shown; findings below may reference images not displayed]

FINDINGS: Normal sized heart. Clear lungs. Mild central peribronchial
thickening with improvement. Cholecystectomy clips. Minimal
scoliosis.
IMPRESSION: Mild bronchitic changes with improvement.

## 2019-05-17 DIAGNOSIS — E559 Vitamin D deficiency, unspecified: Secondary | ICD-10-CM | POA: Diagnosis not present

## 2019-05-17 DIAGNOSIS — Z79899 Other long term (current) drug therapy: Secondary | ICD-10-CM | POA: Diagnosis not present

## 2019-05-17 DIAGNOSIS — Z0000 Encounter for general adult medical examination without abnormal findings: Secondary | ICD-10-CM | POA: Diagnosis not present

## 2019-05-17 DIAGNOSIS — D510 Vitamin B12 deficiency anemia due to intrinsic factor deficiency: Secondary | ICD-10-CM | POA: Diagnosis not present

## 2019-05-17 DIAGNOSIS — R05 Cough: Secondary | ICD-10-CM | POA: Diagnosis not present

## 2019-05-17 DIAGNOSIS — Z8639 Personal history of other endocrine, nutritional and metabolic disease: Secondary | ICD-10-CM | POA: Diagnosis not present

## 2019-05-24 ENCOUNTER — Encounter: Payer: Self-pay | Admitting: *Deleted

## 2019-05-24 DIAGNOSIS — R030 Elevated blood-pressure reading, without diagnosis of hypertension: Secondary | ICD-10-CM | POA: Diagnosis not present

## 2019-05-24 DIAGNOSIS — Z8639 Personal history of other endocrine, nutritional and metabolic disease: Secondary | ICD-10-CM | POA: Diagnosis not present

## 2019-05-24 DIAGNOSIS — E559 Vitamin D deficiency, unspecified: Secondary | ICD-10-CM | POA: Diagnosis not present

## 2019-05-24 DIAGNOSIS — R350 Frequency of micturition: Secondary | ICD-10-CM | POA: Diagnosis not present

## 2019-05-28 ENCOUNTER — Ambulatory Visit (INDEPENDENT_AMBULATORY_CARE_PROVIDER_SITE_OTHER): Payer: BC Managed Care – PPO

## 2019-05-28 ENCOUNTER — Ambulatory Visit (HOSPITAL_COMMUNITY)
Admission: EM | Admit: 2019-05-28 | Discharge: 2019-05-28 | Disposition: A | Discharge status: Home / Self Care | Payer: BC Managed Care – PPO | Attending: Family Medicine | Admitting: Family Medicine

## 2019-05-28 ENCOUNTER — Other Ambulatory Visit: Payer: Self-pay

## 2019-05-28 ENCOUNTER — Encounter (HOSPITAL_COMMUNITY): Payer: Self-pay

## 2019-05-28 DIAGNOSIS — G8929 Other chronic pain: Secondary | ICD-10-CM | POA: Diagnosis not present

## 2019-05-28 DIAGNOSIS — M21612 Bunion of left foot: Secondary | ICD-10-CM

## 2019-05-28 DIAGNOSIS — M545 Low back pain: Secondary | ICD-10-CM

## 2019-05-28 MED ORDER — DIAZEPAM 5 MG PO TABS: 5.0000 mg | ORAL_TABLET | Freq: Every evening | ORAL | 0 refills | Status: DC | PRN

## 2019-05-28 MED ORDER — DICLOFENAC SODIUM 75 MG PO TBEC: 75.0000 mg | DELAYED_RELEASE_TABLET | Freq: Two times a day (BID) | ORAL | 0 refills | Status: DC

## 2019-05-28 NOTE — ED Triage Notes (Addendum)
Pt report having back pain and left foot pain pain for 4 months on and off. Pt report she was using Naproxen and her PCP told her to stop using because makes her be constipated.

## 2019-05-28 NOTE — Discharge Instructions (Addendum)
Your back x-rays are normal That means that you have muscular/ligamentous back pain We should continue an anti-inflammatory medication I am going to try another muscle relaxer for bedtime that does not cause side effects like the last one I recommend that you call your primary care doctor and ask for physical therapy

## 2019-05-28 NOTE — ED Provider Notes (Signed)
[place]    CSN: [id number] Arrival date & time: 05/28/19  1445      History   Chief Complaint Chief Complaint  Patient presents with  . Back Pain  . Foot Pain    HPI Kathryn Norman is a 38 y.o. female.   HPI  Patient is here for follow-up of her low back pain.  She was seen on 03/12/2019 for low back pain.  She had fallen 1 week prior.  It is documented that she had right low back and hip pain after this fall.  She was treated with Naprosyn, which she can tolerate, and methocarbamol which she cannot tolerate.  She states that she has an irritable bladder and the methocarbamol increased her symptoms of frequency and incontinence.  She states it also caused her to feel constipated.  She called her PCP who told her to stop this medication. She works for [employer].  She does with a lot of standing walking bending and some light lifting.  She states that there is some days that she wakes up with pain and then goes to bed with pain, and that it does not go away. No radiation of pain.  No numbness or weakness.  No bowel or bladder complaint, now that she is off the methocarbamol.  No prior history of back problems. She has a history of migraines, bipolar disorder, depression.  She states these are all well controlled on medication.  Asthma causes her problems periodically.  She is good about using her inhalers.    Past Medical History:  Diagnosis Date  . ADHD (attention deficit hyperactivity disorder)   . Anemia   . Anxiety and depression 06/19/2012  . Asthma   . Chlamydia infection 10/18/2014   Treated  . Depression   . Hemorrhagic ovarian cyst 09/07/2012  . Migraines     Patient Active Problem List   Diagnosis Date Noted  . Amenorrhea 12/08/2018  . Migraine with aura and with status migrainosus, not intractable 11/23/2018  . Bipolar disorder, unspecified ([place]) 11/23/2018  . Visual hallucination 11/23/2018  . Major depressive disorder 11/23/2018  . Pap smear of  cervix on 10/18/2014 shows high risk HPV present 06/25/2018  . Anxiety and depression 06/19/2012  . Chronic headaches 06/19/2012    Past Surgical History:  Procedure Laterality Date  . CHOLECYSTECTOMY    . DILATION AND CURETTAGE OF UTERUS     x 2    OB History    Gravida  6   Para  3   Term  2   Preterm  1   AB  3   Living  3     SAB  3   TAB      Ectopic      Multiple      Live Births               Home Medications    Prior to Admission medications   Medication Sig Start Date End Date Taking? Authorizing Provider  albuterol (PROAIR HFA) 108 (90 Base) MCG/ACT inhaler Inhale 1-2 puffs into the lungs every 6 (six) hours as needed for wheezing or shortness of breath. 02/21/19   Augusto Gamble B, NP  albuterol (PROVENTIL) (2.5 MG/3ML) 0.083% nebulizer solution Take 3 mLs (2.5 mg total) by nebulization every 6 (six) hours as needed for wheezing or shortness of breath. 03/31/19   Augusto Gamble B, NP  ARIPiprazole (ABILIFY) 10 MG tablet Take 10 mg by mouth daily.    [provider]  atomoxetine (STRATTERA) 40 MG capsule Take 40 mg by mouth daily.    [provider]  beclomethasone (QVAR REDIHALER) 80 MCG/ACT inhaler Inhale 1 puff into the lungs 2 (two) times daily. 04/02/19   Eustace MooreNelson, Blessin Kanno Sue, MD  beclomethasone (QVAR) 40 MCG/ACT inhaler Inhale 1 puff into the lungs 2 (two) times daily before a meal. 03/31/19   Burky, Barron AlvineNatalie B, NP  diazepam (VALIUM) 5 MG tablet Take 1 tablet (5 mg total) by mouth at bedtime as needed for muscle spasms. 05/28/19   Eustace MooreNelson, Wiletta Bermingham Sue, MD  diclofenac (VOLTAREN) 75 MG EC tablet Take 1 tablet (75 mg total) by mouth 2 (two) times daily. 05/28/19   Eustace MooreNelson, Sherria Riemann Sue, MD  EMGALITY 120 MG/ML SOAJ INJECT 240MG  ONCE THEN 120MG  MONTHLY 04/02/19   Glyn Adeeague Clark, Scot JunKaren E, PA-C  ibuprofen (ADVIL,MOTRIN) 800 MG tablet Take 1 tablet (800 mg total) by mouth every 8 (eight) hours as needed. 11/23/18   Glyn Adeeague Clark, Scot JunKaren E, PA-C   medroxyPROGESTERone (PROVERA) 10 MG tablet TAKE 1 TABLET (10 MG TOTAL) BY MOUTH DAILY. USE FOR TEN DAYS Patient not taking: Reported on 03/23/2019 11/22/18   Anyanwu, Jethro BastosUgonna A, MD  megestrol (MEGACE) 40 MG tablet Take 1 tablet (40 mg total) by mouth 2 (two) times daily. Patient not taking: Reported on 03/23/2019 12/21/18   Tereso NewcomerAnyanwu, Ugonna A, MD  mirtazapine (REMERON) 15 MG tablet Take 15 mg by mouth at bedtime.    [provider]  SUMAtriptan (IMITREX) 100 MG tablet Take 1 tablet (100 mg total) by mouth once as needed for up to 1 dose for migraine. May repeat in 2 hours if headache persists or recurs. 11/23/18   Glyn Adeeague Clark, Scot JunKaren E, PA-C  tolterodine (DETROL LA) 2 MG 24 hr capsule TAKE 1 CAPSULE (2 MG TOTAL) BY MOUTH DAILY. 05/18/19   Katrinka BlazingSmith, [place], CNM  traZODone (DESYREL) 50 MG tablet Take 50 mg by mouth at bedtime.    [provider]  cetirizine (ZYRTEC) 10 MG tablet Take 1 tablet (10 mg total) by mouth daily. 03/31/19 05/28/19  Georgetta HaberBurky, Natalie B, NP    Family History Family History  Problem Relation Age of Onset  . Migraines Mother   . Alopecia Mother   . Sickle cell trait Mother   . Cancer Father        died of lung cancer  . Sickle cell anemia Sister   . Seizures Brother     Social History Social History   Tobacco Use  . Smoking status: Former Smoker    Packs/day: 0.10    Types: Cigarettes    Start date: 10/21/2011  . Smokeless tobacco: Never Used  Substance Use Topics  . Alcohol use: No    Alcohol/week: 0.0 standard drinks  . Drug use: No     Allergies   Methocarbamol, Acetaminophen, and Red dye   Review of Systems Review of Systems  Constitutional: Negative for chills and fever.  HENT: Negative for ear pain and sore throat.   Eyes: Negative for pain and visual disturbance.  Respiratory: Negative for cough and shortness of breath.   Cardiovascular: Negative for chest pain and palpitations.  Gastrointestinal: Negative for abdominal pain and  vomiting.  Genitourinary: Negative for dysuria and hematuria.  Musculoskeletal: Positive for back pain and gait problem. Negative for arthralgias.  Skin: Negative for color change and rash.  Neurological: Negative for seizures, syncope, weakness and numbness.  All other systems reviewed and are negative.    Physical Exam Triage  Vital Signs ED Triage Vitals  Enc Vitals Group     BP 05/28/19 1522 116/79     Pulse Rate 05/28/19 1522 (!) 57     Resp 05/28/19 1522 15     Temp 05/28/19 1522 98.2 F (36.8 C)     Temp Source 05/28/19 1522 Temporal     SpO2 05/28/19 1522 100 %     Weight --      Height --      Head Circumference --      Peak Flow --      Pain Score 05/28/19 1520 7     Pain Loc --      Pain Edu? --      Excl. in GC? --    No data found.  Updated Vital Signs BP 116/79 (BP Location: Right Arm)   Pulse (!) 57   Temp 98.2 F (36.8 C) (Temporal)   Resp 15   SpO2 100%      Physical Exam Constitutional:      General: She is not in acute distress.    Appearance: She is well-developed and normal weight.  HENT:     Head: Normocephalic and atraumatic.  Eyes:     Conjunctiva/sclera: Conjunctivae normal.     Pupils: Pupils are equal, round, and reactive to light.  Neck:     Musculoskeletal: Normal range of motion and neck supple.  Cardiovascular:     Rate and Rhythm: Normal rate and regular rhythm.     Heart sounds: Normal heart sounds.  Pulmonary:     Effort: Pulmonary effort is normal. No respiratory distress.  Abdominal:     General: There is no distension.     Palpations: Abdomen is soft.  Musculoskeletal: Normal range of motion.     Comments: Tenderness over left SI and left lumbar column of muscles.  There is tenderness over the left hallux MCP joint consistent with early bunion.  Strength sensation range of motion reflexes are normal in both lower extremities.  Straight leg raise mildly increases low back pain but causes no radiculopathy.  Skin:     General: Skin is warm and dry.  Neurological:     Mental Status: She is alert.  Psychiatric:        Mood and Affect: Mood normal.        Behavior: Behavior normal.      UC Treatments / Results  Labs (all labs ordered are listed, but only abnormal results are displayed) Labs Reviewed - No data to display  EKG   Radiology Dg Lumbar Spine Complete  Result Date: 05/28/2019 CLINICAL DATA:  Low back pain x 3 months after fall. Constant pain but pain is worsening. Hurts to walk, sit down, and laying down. No previous injury or fx. EXAM: LUMBAR SPINE - COMPLETE 4+ VIEW COMPARISON:  None. FINDINGS: Normal alignment. Vertebral body heights are maintained without evidence of fracture. No significant degenerative changes. No focal bony lesion. SI joints are open. No acute finding in the visualized pelvis. Nonobstructive bowel gas pattern. Cholecystectomy clips noted in the right upper quadrant. IMPRESSION: No acute osseous abnormality in the lumbar spine. Electronically Signed   By: Emmaline Kluver M.D.   On: 05/28/2019 16:35    Procedures Procedures (including critical care time)  Medications Ordered in UC Medications - No data to display  Initial Impression / Assessment and Plan / UC Course  I have reviewed the triage vital signs and the nursing notes.  Pertinent labs & imaging results  that were available during my care of the patient were reviewed by me and considered in my medical decision making (see chart for details).    Discussed normal x-rays.  reCommend calling her primary care doctor for a physical therapy referral.  Discussed use of Valium as a muscle relaxer, precautions, side effects Final Clinical Impressions(s) / UC Diagnoses   Final diagnoses:  Chronic left-sided low back pain without sciatica  Bunion, left     Discharge Instructions     Your back x-rays are normal That means that you have muscular/ligamentous back pain We should continue an anti-inflammatory  medication I am going to try another muscle relaxer for bedtime that does not cause side effects like the last one I recommend that you call your primary care doctor and ask for physical therapy    ED Prescriptions    Medication Sig Dispense Auth. Provider   diclofenac (VOLTAREN) 75 MG EC tablet Take 1 tablet (75 mg total) by mouth 2 (two) times daily. 60 tablet Eustace Moore, MD   diazepam (VALIUM) 5 MG tablet Take 1 tablet (5 mg total) by mouth at bedtime as needed for muscle spasms. 10 tablet Eustace Moore, MD     PDMP not reviewed this encounter.   Eustace Moore, MD 05/28/19 2027

## 2019-05-31 ENCOUNTER — Ambulatory Visit: Payer: BC Managed Care – PPO | Admitting: Obstetrics & Gynecology

## 2019-06-04 ENCOUNTER — Other Ambulatory Visit: Payer: Self-pay | Admitting: Physician Assistant

## 2019-06-14 ENCOUNTER — Other Ambulatory Visit: Payer: Self-pay

## 2019-06-14 ENCOUNTER — Encounter: Payer: Self-pay | Admitting: Obstetrics and Gynecology

## 2019-06-14 ENCOUNTER — Ambulatory Visit (INDEPENDENT_AMBULATORY_CARE_PROVIDER_SITE_OTHER): Payer: BC Managed Care – PPO | Admitting: Obstetrics and Gynecology

## 2019-06-14 VITALS — BP 136/88 | HR 63 | Wt 137.0 lb

## 2019-06-14 DIAGNOSIS — N911 Secondary amenorrhea: Secondary | ICD-10-CM

## 2019-06-14 MED ORDER — MEDROXYPROGESTERONE ACETATE 10 MG PO TABS: 10.0000 mg | ORAL_TABLET | Freq: Every day | ORAL | 2 refills | Status: DC

## 2019-06-14 NOTE — Progress Notes (Signed)
38 yo P3 with secondary amenorrhea. Patient is aware of normal lab values in July. She is no longer interested in contraception as she desires to conceive. She reports ovulating last month based on ovulation predictor kits but did not have a period. Patient states that up until 3 months ago she had a regular 3-4 day period. She took several home pregnancy tests which were all negative. Patient is without other complaints  Past Medical History:  Diagnosis Date  . ADHD (attention deficit hyperactivity disorder)   . Anemia   . Anxiety and depression 06/19/2012  . Asthma   . Chlamydia infection 10/18/2014   Treated  . Depression   . Hemorrhagic ovarian cyst 09/07/2012  . Migraines    Past Surgical History:  Procedure Laterality Date  . CHOLECYSTECTOMY    . DILATION AND CURETTAGE OF UTERUS     x 2   Family History  Problem Relation Age of Onset  . Migraines Mother   . Alopecia Mother   . Sickle cell trait Mother   . Cancer Father        died of lung cancer  . Sickle cell anemia Sister   . Seizures Brother    Social History   Tobacco Use  . Smoking status: Former Smoker    Packs/day: 0.10    Types: Cigarettes    Start date: 10/21/2011  . Smokeless tobacco: Never Used  Substance Use Topics  . Alcohol use: No    Alcohol/week: 0.0 standard drinks  . Drug use: No   ROS See pertinent in HPI  Blood pressure 136/88, pulse 63, weight 137 lb (62.1 kg). GENERAL: Well-developed, well-nourished female in no acute distress.  EXTREMITIES: No cyanosis, clubbing, or edema, 2+ distal pulses.  A/P 38 yo with secondary amenorrhea - progesterone challenge test provided - Patient advised to take prenatal vitamins - RTC prn

## 2019-11-04 ENCOUNTER — Telehealth: Payer: Self-pay | Admitting: Radiology

## 2019-11-04 NOTE — Telephone Encounter (Signed)
Left message for patient to call office to schedule April appointment with Nada Maclachlan for Headache f/u

## 2019-11-12 ENCOUNTER — Other Ambulatory Visit: Payer: Self-pay | Admitting: Physician Assistant

## 2019-12-01 ENCOUNTER — Encounter (HOSPITAL_COMMUNITY): Payer: Self-pay

## 2019-12-01 ENCOUNTER — Ambulatory Visit (HOSPITAL_COMMUNITY)
Admission: EM | Admit: 2019-12-01 | Discharge: 2019-12-01 | Disposition: A | Discharge status: Home / Self Care | Payer: BC Managed Care – PPO | Attending: Family Medicine | Admitting: Family Medicine

## 2019-12-01 ENCOUNTER — Other Ambulatory Visit: Payer: Self-pay

## 2019-12-01 DIAGNOSIS — R05 Cough: Secondary | ICD-10-CM

## 2019-12-01 DIAGNOSIS — J302 Other seasonal allergic rhinitis: Secondary | ICD-10-CM | POA: Diagnosis not present

## 2019-12-01 DIAGNOSIS — J45901 Unspecified asthma with (acute) exacerbation: Secondary | ICD-10-CM

## 2019-12-01 DIAGNOSIS — R062 Wheezing: Secondary | ICD-10-CM | POA: Diagnosis not present

## 2019-12-01 DIAGNOSIS — R059 Cough, unspecified: Secondary | ICD-10-CM

## 2019-12-01 DIAGNOSIS — R0602 Shortness of breath: Secondary | ICD-10-CM

## 2019-12-01 MED ORDER — MONTELUKAST SODIUM 10 MG PO TABS: 10.0000 mg | ORAL_TABLET | Freq: Every day | ORAL | 2 refills | Status: AC

## 2019-12-01 MED ORDER — PREDNISONE 10 MG (21) PO TBPK: ORAL_TABLET | Freq: Every day | ORAL | 0 refills | Status: AC

## 2019-12-01 MED ORDER — DEXAMETHASONE SODIUM PHOSPHATE 10 MG/ML IJ SOLN
INTRAMUSCULAR | Status: AC
  Filled 2019-12-01: qty 1

## 2019-12-01 MED ORDER — ALBUTEROL SULFATE HFA 108 (90 BASE) MCG/ACT IN AERS: 1.0000 | INHALATION_SPRAY | Freq: Four times a day (QID) | RESPIRATORY_TRACT | 0 refills | Status: AC | PRN

## 2019-12-01 MED ORDER — DEXAMETHASONE SODIUM PHOSPHATE 10 MG/ML IJ SOLN
10.0000 mg | Freq: Once | INTRAMUSCULAR | Status: AC
  Administered 2019-12-01: 15:00:00 10 mg via INTRAMUSCULAR

## 2019-12-01 NOTE — ED Triage Notes (Signed)
Pt c/o productive cough w/clear/white phlegm, chest congestion, SOBx3 days. Pt states her chest feels tight. Pt states she woke up this morning hoarse. Pt states she's been having shallow respirations. Pt has non labored breathing. Pt has expiratory wheezes throughout lungs

## 2019-12-01 NOTE — Discharge Instructions (Addendum)
You are experiencing an asthma exacerbation. I have sent in a refill for your albuterol rescue inhaler.   You have gotten a steroid injection in the office to help open up your lungs.  I have also sent in Singulair for you to take daily. I have also sent in a prednisone taper for you to start tomorrow.   Follow up with the ER for trouble swallowing, trouble breathing, or other concerning symptoms.

## 2019-12-03 NOTE — ED Provider Notes (Signed)
[facility]    CSN: [id number] Arrival date & time: 12/01/19  1407      History   Chief Complaint Chief Complaint  Patient presents with  . Cough    HPI Kathryn Norman is a 39 y.o. female.   Patient reports that she has been having a productive cough with clear/white phlegm, chest and nasal congestion, shortness of breath for the last 3 days.  Also states that she is experiencing hoarseness in her voice.  Patient reports that she thinks she is having shallow respirations and feels like she cannot get a good breath.  Has made no attempt to treat this at home.  Upon chart review, patient has medical history significant for bipolar, anxiety, depression, hemorrhagic ovarian cyst.  Patient denies headache, chills, body aches, nausea, vomiting, diarrhea, rash, fever, or other symptoms.  ROS Per HPI  The history is provided by the patient.  Cough   Past Medical History:  Diagnosis Date  . ADHD (attention deficit hyperactivity disorder)   . Anemia   . Anxiety and depression 06/19/2012  . Asthma   . Chlamydia infection 10/18/2014   Treated  . Depression   . Hemorrhagic ovarian cyst 09/07/2012  . Migraines     Patient Active Problem List   Diagnosis Date Noted  . Amenorrhea 12/08/2018  . Migraine with aura and with status migrainosus, not intractable 11/23/2018  . Bipolar disorder, unspecified (HCC) 11/23/2018  . Visual hallucination 11/23/2018  . Major depressive disorder 11/23/2018  . Pap smear of cervix on 10/18/2014 shows high risk HPV present 06/25/2018  . Anxiety and depression 06/19/2012  . Chronic headaches 06/19/2012    Past Surgical History:  Procedure Laterality Date  . CHOLECYSTECTOMY    . DILATION AND CURETTAGE OF UTERUS     x 2    OB History    Gravida  6   Para  3   Term  2   Preterm  1   AB  3   Living  3     SAB  3   TAB      Ectopic      Multiple      Live Births               Home Medications    Prior to  Admission medications   Medication Sig Start Date End Date Taking? Authorizing Provider  albuterol (PROAIR HFA) 108 (90 Base) MCG/ACT inhaler Inhale 1-2 puffs into the lungs every 6 (six) hours as needed for wheezing or shortness of breath. 12/01/19   Moshe Cipro, NP  albuterol (PROVENTIL) (2.5 MG/3ML) 0.083% nebulizer solution Take 3 mLs (2.5 mg total) by nebulization every 6 (six) hours as needed for wheezing or shortness of breath. 03/31/19   Linus Mako B, NP  ARIPiprazole (ABILIFY) 10 MG tablet Take 10 mg by mouth daily.    [provider]  atomoxetine (STRATTERA) 40 MG capsule Take 40 mg by mouth daily.    [provider]  beclomethasone (QVAR REDIHALER) 80 MCG/ACT inhaler Inhale 1 puff into the lungs 2 (two) times daily. 04/02/19   Eustace Moore, MD  beclomethasone (QVAR) 40 MCG/ACT inhaler Inhale 1 puff into the lungs 2 (two) times daily before a meal. 03/31/19   Burky, Barron Alvine, NP  diazepam (VALIUM) 5 MG tablet Take 1 tablet (5 mg total) by mouth at bedtime as needed for muscle spasms. 05/28/19   Eustace Moore, MD  diclofenac (VOLTAREN) 75 MG EC tablet Take  1 tablet (75 mg total) by mouth 2 (two) times daily. 05/28/19   Eustace Moore, MD  EMGALITY 120 MG/ML SOAJ INJECT 240MG  ONCE THEN 120MG  MONTHLY 04/02/19   , 06/02/19, PA-C  ibuprofen (ADVIL) 800 MG tablet TAKE 1 TABLET BY MOUTH EVERY 8 HOURS AS NEEDED 06/11/19   Scot Jun, 06/13/19, PA-C  medroxyPROGESTERone (PROVERA) 10 MG tablet TAKE 1 TABLET (10 MG TOTAL) BY MOUTH DAILY. USE FOR TEN DAYS Patient not taking: Reported on 03/23/2019 11/22/18   03/25/2019, MD  medroxyPROGESTERone (PROVERA) 10 MG tablet Take 1 tablet (10 mg total) by mouth daily. Use for ten days 06/14/19   Constant, Peggy, MD  megestrol (MEGACE) 40 MG tablet Take 1 tablet (40 mg total) by mouth 2 (two) times daily. Patient not taking: Reported on 03/23/2019 12/21/18   03/25/2019, MD  mirtazapine (REMERON) 15 MG  tablet Take 15 mg by mouth at bedtime.    [provider]  montelukast (SINGULAIR) 10 MG tablet Take 1 tablet (10 mg total) by mouth at bedtime. 12/01/19 12/31/19  01/31/20, NP  predniSONE (STERAPRED UNI-PAK 21 TAB) 10 MG (21) TBPK tablet Take by mouth daily for 6 days. Take 6 tablets on day 1, 5 tablets on day 2, 4 tablets on day 3, 3 tablets on day 4, 2 tablets on day 5, 1 tablet on day 6 12/01/19 12/07/19  01/31/20, NP  SUMAtriptan (IMITREX) 100 MG tablet Take 1 tablet (100 mg total) by mouth once as needed for up to 1 dose for migraine. May repeat in 2 hours if headache persists or recurs. 11/23/18   Moshe Cipro, 11/25/18, PA-C  tolterodine (DETROL LA) 2 MG 24 hr capsule TAKE 1 CAPSULE (2 MG TOTAL) BY MOUTH DAILY. 05/18/19   Scot Jun, 05/20/19, CNM  traZODone (DESYREL) 50 MG tablet Take 50 mg by mouth at bedtime.    [provider]  cetirizine (ZYRTEC) 10 MG tablet Take 1 tablet (10 mg total) by mouth daily. 03/31/19 05/28/19  05/31/19, NP    Family History Family History  Problem Relation Age of Onset  . Migraines Mother   . Alopecia Mother   . Sickle cell trait Mother   . Cancer Father        died of lung cancer  . Sickle cell anemia Sister   . Seizures Brother     Social History Social History   Tobacco Use  . Smoking status: Former Smoker    Packs/day: 0.10    Types: Cigarettes    Start date: 10/21/2011  . Smokeless tobacco: Never Used  Substance Use Topics  . Alcohol use: No    Alcohol/week: 0.0 standard drinks  . Drug use: No     Allergies   Methocarbamol, Acetaminophen, and Red dye   Review of Systems Review of Systems  Respiratory: Positive for cough.      Physical Exam Triage Vital Signs ED Triage Vitals [12/01/19 1454]  Enc Vitals Group     BP 127/83     Pulse Rate 73     Resp 16     Temp 98 F (36.7 C)     Temp Source Oral     SpO2 100 %     Weight 130 lb (59 kg)     Height 5\' 3"  (1.6 m)     Head Circumference       Peak Flow      Pain Score 0  Pain Loc      Pain Edu?      Excl. in [place]?    No data found.  Updated Vital Signs BP 127/83   Pulse 73   Temp 98 F (36.7 C) (Oral)   Resp 16   Ht 5\' 3"  (1.6 m)   Wt 130 lb (59 kg)   SpO2 100%   BMI 23.03 kg/m   Visual Acuity Right Eye Distance:   Left Eye Distance:   Bilateral Distance:    Right Eye Near:   Left Eye Near:    Bilateral Near:     Physical Exam Vitals and nursing note reviewed.  Constitutional:      General: She is not in acute distress.    Appearance: Normal appearance. She is well-developed and normal weight. She is ill-appearing.  HENT:     Head: Normocephalic and atraumatic.     Right Ear: Tympanic membrane normal.     Left Ear: Tympanic membrane normal.     Nose: Congestion and rhinorrhea present.     Mouth/Throat:     Mouth: Mucous membranes are moist.     Pharynx: Oropharynx is clear.  Eyes:     Extraocular Movements: Extraocular movements intact.     Conjunctiva/sclera: Conjunctivae normal.     Pupils: Pupils are equal, round, and reactive to light.  Cardiovascular:     Rate and Rhythm: Normal rate and regular rhythm.     Heart sounds: Normal heart sounds. No murmur.  Pulmonary:     Effort: Pulmonary effort is normal. No respiratory distress.     Breath sounds: No stridor. Examination of the right-middle field reveals wheezing. Examination of the left-middle field reveals wheezing. Examination of the right-lower field reveals wheezing. Examination of the left-lower field reveals wheezing. Wheezing present. No decreased breath sounds, rhonchi or rales.  Chest:     Chest wall: No tenderness.  Abdominal:     General: Bowel sounds are normal. There is no distension.     Palpations: Abdomen is soft. There is no mass.     Tenderness: There is no abdominal tenderness. There is no right CVA tenderness, left CVA tenderness, guarding or rebound.     Hernia: No hernia is present.  Musculoskeletal:         General: Normal range of motion.     Cervical back: Normal range of motion and neck supple.  Lymphadenopathy:     Cervical: No cervical adenopathy.  Skin:    General: Skin is warm and dry.     Capillary Refill: Capillary refill takes less than 2 seconds.  Neurological:     General: No focal deficit present.     Mental Status: She is alert and oriented to person, place, and time.  Psychiatric:        Mood and Affect: Mood normal.        Behavior: Behavior normal.        Thought Content: Thought content normal.      UC Treatments / Results  Labs (all labs ordered are listed, but only abnormal results are displayed) Labs Reviewed - No data to display  EKG   Radiology No results found.  Procedures Procedures (including critical care time)  Medications Ordered in UC Medications  dexamethasone (DECADRON) injection 10 mg (10 mg Intramuscular Given 12/01/19 1523)    Initial Impression / Assessment and Plan / UC Course  I have reviewed the triage vital signs and the nursing notes.  Pertinent labs & imaging results that  were available during my care of the patient were reviewed by me and considered in my medical decision making (see chart for details).     Asthma exacerbation: Presents with cough, wheezing, shortness of breath, nasal congestion, hoarseness of her voice.  Noted nasal congestion, diffuse wheezing bilateral lung fields.  Patient received Decadron 10 mg IM in the office today.  Prescribed Singulair 10 mg tablets daily for patient to take to help manage asthma.  Also prescribed albuterol rescue inhaler for patient today to use every 4-6 hours 2 puffs as needed for wheezing.  Also prescribed prednisone taper for patient to take.  Discussed directions for taking this medication.  Issues could be related to seasonal allergies with allergic rhinitis.  Instructed patient that if she is not feeling better over the next 2 days that she may follow-up with primary care with this  office as needed instructed patient that if she is experiencing trouble swallowing, trouble breathing, high fever, other concerning symptoms she is to follow-up the ER for further evaluation and treatme  Patient verbalizes understanding and agrees with treatment plan. Final Clinical Impressions(s) / UC Diagnoses   Final diagnoses:  Cough  Exacerbation of asthma, unspecified asthma severity, unspecified whether persistent  Wheezing  Seasonal allergies  Shortness of breath     Discharge Instructions     You are experiencing an asthma exacerbation. I have sent in a refill for your albuterol rescue inhaler.   You have gotten a steroid injection in the office to help open up your lungs.  I have also sent in Singulair for you to take daily. I have also sent in a prednisone taper for you to start tomorrow.   Follow up with the ER for trouble swallowing, trouble breathing, or other concerning symptoms.    ED Prescriptions    Medication Sig Dispense Auth. Provider   predniSONE (STERAPRED UNI-PAK 21 TAB) 10 MG (21) TBPK tablet Take by mouth daily for 6 days. Take 6 tablets on day 1, 5 tablets on day 2, 4 tablets on day 3, 3 tablets on day 4, 2 tablets on day 5, 1 tablet on day 6 21 tablet Moshe Cipro, NP   albuterol (PROAIR HFA) 108 (90 Base) MCG/ACT inhaler Inhale 1-2 puffs into the lungs every 6 (six) hours as needed for wheezing or shortness of breath. 8 g Moshe Cipro, NP   montelukast (SINGULAIR) 10 MG tablet Take 1 tablet (10 mg total) by mouth at bedtime. 30 tablet Moshe Cipro, NP     PDMP not reviewed this encounter.   Moshe Cipro, NP 12/03/19 1810

## 2019-12-06 DIAGNOSIS — J4520 Mild intermittent asthma, uncomplicated: Secondary | ICD-10-CM | POA: Diagnosis not present

## 2019-12-23 ENCOUNTER — Other Ambulatory Visit: Payer: Self-pay | Admitting: Obstetrics & Gynecology

## 2019-12-23 DIAGNOSIS — N939 Abnormal uterine and vaginal bleeding, unspecified: Secondary | ICD-10-CM

## 2020-01-17 ENCOUNTER — Other Ambulatory Visit: Payer: Self-pay

## 2020-01-17 ENCOUNTER — Ambulatory Visit (INDEPENDENT_AMBULATORY_CARE_PROVIDER_SITE_OTHER): Payer: BC Managed Care – PPO | Admitting: Obstetrics & Gynecology

## 2020-01-17 ENCOUNTER — Encounter: Payer: Self-pay | Admitting: Obstetrics & Gynecology

## 2020-01-17 VITALS — BP 136/87 | HR 80

## 2020-01-17 DIAGNOSIS — N3281 Overactive bladder: Secondary | ICD-10-CM | POA: Diagnosis not present

## 2020-01-17 DIAGNOSIS — R350 Frequency of micturition: Secondary | ICD-10-CM | POA: Diagnosis not present

## 2020-01-17 DIAGNOSIS — Z3202 Encounter for pregnancy test, result negative: Secondary | ICD-10-CM

## 2020-01-17 LAB — POCT URINE QUALITATIVE DIPSTICK BLOOD: Blood, UA: NEGATIVE

## 2020-01-17 LAB — POCT URINE PREGNANCY: Preg Test, Ur: NEGATIVE

## 2020-01-17 MED ORDER — TOLTERODINE TARTRATE ER 4 MG PO CP24: 4.0000 mg | ORAL_CAPSULE | Freq: Every day | ORAL | 9 refills | Status: DC

## 2020-01-17 NOTE — Patient Instructions (Signed)

## 2020-01-17 NOTE — Progress Notes (Signed)
GYNECOLOGY OFFICE VISIT NOTE  History:   Kathryn Norman is a 39 y.o. [id number] here today for report of continued urinary frequency.  Has to wear pads all day long for urine.  Was started on Detrol LA 2 mg daily, this helped but only if she takes two pills. No dysuria, no abdominal pain. She desires further management.  Does not think she is pregnant, but does not use any birth control.  Had normal BMET and HgA1C earlier this year with PCP.  She denies any abnormal vaginal discharge, bleeding, pelvic pain or other concerns.    Past Medical History:  Diagnosis Date  . ADHD (attention deficit hyperactivity disorder)   . Anemia   . Anxiety and depression 06/19/2012  . Asthma   . Chlamydia infection 10/18/2014   Treated  . Depression   . Hemorrhagic ovarian cyst 09/07/2012  . Migraines     Past Surgical History:  Procedure Laterality Date  . CHOLECYSTECTOMY    . DILATION AND CURETTAGE OF UTERUS     x 2    The following portions of the patient's history were reviewed and updated as appropriate: allergies, current medications, past family history, past medical history, past social history, past surgical history and problem list.   Health Maintenance:  Normal pap and negative HRHPV on 06/25/2018.    Review of Systems:  Pertinent items noted in HPI and remainder of comprehensive ROS otherwise negative.  Physical Exam:  BP 136/87   Pulse 80  CONSTITUTIONAL: Well-developed, well-nourished female in no acute distress.  HEENT:  Normocephalic, atraumatic. External right and left ear normal. No scleral icterus.  NECK: Normal range of motion, supple, no masses noted on observation SKIN: No rash noted. Not diaphoretic. No erythema. No pallor. MUSCULOSKELETAL: Normal range of motion. No edema noted. NEUROLOGIC: Alert and oriented to person, place, and time. Normal muscle tone coordination. No cranial nerve deficit noted. PSYCHIATRIC: Normal mood and affect. Normal behavior. Normal  judgment and thought content. CARDIOVASCULAR: Normal heart rate noted RESPIRATORY: Effort and breath sounds normal, no problems with respiration noted ABDOMEN: No masses noted. No other overt distention noted.   PELVIC: Normal appearing external genitalia; normal urethral meatus; Mild or Grade 1 Cystocele noted. Normal appearing vaginal mucosa and cervix.  No abnormal discharge noted.  Normal uterine size, no other palpable masses, no uterine or adnexal tenderness. Performed in the presence of a chaperone  Labs and Imaging Results for orders placed or performed in visit on 01/17/20 (from the past 168 hour(s))  POCT urine qual dipstick blood   Collection Time: 01/17/20 11:42 AM  Result Value Ref Range   Blood, UA negative   POCT urine pregnancy   Collection Time: 01/17/20 11:42 AM  Result Value Ref Range   Preg Test, Ur Negative Negative      Assessment and Plan:      1. Frequent urination 2. OAB (overactive bladder) POCT UA and pregnancy test negative today. Given her history, Detrol LA increased to 4 mg daily. Will also refer to Urology for further evaluation. Of note, she reported her mother had same issue and needed a pacemaker placed in her bladder.  - Urine Culture - tolterodine (DETROL LA) 4 MG 24 hr capsule; Take 1 capsule (4 mg total) by mouth daily.  Dispense: 90 capsule; Refill: 9 - Ambulatory referral to Urology  Routine preventative health maintenance measures emphasized. Please refer to After Visit Summary for other counseling recommendations.   Return for any gynecologic concerns.  Total face-to-face time with patient: 20 minutes.  Over 50% of encounter was spent on counseling and coordination of care.   Verita Schneiders, MD, [place] for [employer], [place]

## 2020-01-19 LAB — URINE CULTURE

## 2020-02-04 DIAGNOSIS — N3946 Mixed incontinence: Secondary | ICD-10-CM | POA: Diagnosis not present

## 2020-02-04 DIAGNOSIS — R350 Frequency of micturition: Secondary | ICD-10-CM | POA: Diagnosis not present

## 2020-02-04 DIAGNOSIS — R3915 Urgency of urination: Secondary | ICD-10-CM | POA: Diagnosis not present

## 2020-02-07 ENCOUNTER — Ambulatory Visit: Payer: BC Managed Care – PPO | Admitting: Obstetrics & Gynecology

## 2020-02-08 ENCOUNTER — Encounter: Payer: Self-pay | Admitting: Obstetrics & Gynecology

## 2020-02-21 DIAGNOSIS — Z1152 Encounter for screening for COVID-19: Secondary | ICD-10-CM | POA: Diagnosis not present

## 2020-02-21 DIAGNOSIS — R05 Cough: Secondary | ICD-10-CM | POA: Diagnosis not present

## 2020-02-21 DIAGNOSIS — J069 Acute upper respiratory infection, unspecified: Secondary | ICD-10-CM | POA: Diagnosis not present

## 2020-02-21 DIAGNOSIS — J302 Other seasonal allergic rhinitis: Secondary | ICD-10-CM | POA: Diagnosis not present

## 2020-02-22 DIAGNOSIS — R350 Frequency of micturition: Secondary | ICD-10-CM | POA: Diagnosis not present

## 2020-02-22 DIAGNOSIS — N3946 Mixed incontinence: Secondary | ICD-10-CM | POA: Diagnosis not present

## 2020-02-22 DIAGNOSIS — M62838 Other muscle spasm: Secondary | ICD-10-CM | POA: Diagnosis not present

## 2020-02-22 DIAGNOSIS — M6289 Other specified disorders of muscle: Secondary | ICD-10-CM | POA: Diagnosis not present

## 2020-03-06 DIAGNOSIS — R350 Frequency of micturition: Secondary | ICD-10-CM | POA: Diagnosis not present

## 2020-03-06 DIAGNOSIS — M62838 Other muscle spasm: Secondary | ICD-10-CM | POA: Diagnosis not present

## 2020-03-06 DIAGNOSIS — M6289 Other specified disorders of muscle: Secondary | ICD-10-CM | POA: Diagnosis not present

## 2020-03-06 DIAGNOSIS — R3915 Urgency of urination: Secondary | ICD-10-CM | POA: Diagnosis not present

## 2020-03-08 ENCOUNTER — Ambulatory Visit: Payer: BC Managed Care – PPO | Admitting: Obstetrics & Gynecology

## 2020-03-28 DIAGNOSIS — M62838 Other muscle spasm: Secondary | ICD-10-CM | POA: Diagnosis not present

## 2020-03-28 DIAGNOSIS — R3915 Urgency of urination: Secondary | ICD-10-CM | POA: Diagnosis not present

## 2020-03-28 DIAGNOSIS — R350 Frequency of micturition: Secondary | ICD-10-CM | POA: Diagnosis not present

## 2020-03-28 DIAGNOSIS — M6289 Other specified disorders of muscle: Secondary | ICD-10-CM | POA: Diagnosis not present

## 2020-04-03 DIAGNOSIS — K5900 Constipation, unspecified: Secondary | ICD-10-CM | POA: Diagnosis not present

## 2020-04-03 DIAGNOSIS — M62838 Other muscle spasm: Secondary | ICD-10-CM | POA: Diagnosis not present

## 2020-04-03 DIAGNOSIS — M6289 Other specified disorders of muscle: Secondary | ICD-10-CM | POA: Diagnosis not present

## 2020-04-03 DIAGNOSIS — M6281 Muscle weakness (generalized): Secondary | ICD-10-CM | POA: Diagnosis not present

## 2020-04-19 DIAGNOSIS — M6289 Other specified disorders of muscle: Secondary | ICD-10-CM | POA: Diagnosis not present

## 2020-04-19 DIAGNOSIS — M62838 Other muscle spasm: Secondary | ICD-10-CM | POA: Diagnosis not present

## 2020-04-19 DIAGNOSIS — M6281 Muscle weakness (generalized): Secondary | ICD-10-CM | POA: Diagnosis not present

## 2020-04-19 DIAGNOSIS — K5900 Constipation, unspecified: Secondary | ICD-10-CM | POA: Diagnosis not present

## 2020-04-27 ENCOUNTER — Other Ambulatory Visit: Payer: Self-pay

## 2020-05-11 DIAGNOSIS — N3946 Mixed incontinence: Secondary | ICD-10-CM | POA: Diagnosis not present

## 2020-05-11 DIAGNOSIS — R350 Frequency of micturition: Secondary | ICD-10-CM | POA: Diagnosis not present

## 2020-05-11 DIAGNOSIS — R3915 Urgency of urination: Secondary | ICD-10-CM | POA: Diagnosis not present

## 2020-06-22 ENCOUNTER — Other Ambulatory Visit: Payer: Self-pay | Admitting: Obstetrics and Gynecology

## 2020-06-22 MED ORDER — MEDROXYPROGESTERONE ACETATE 10 MG PO TABS: 10.0000 mg | ORAL_TABLET | Freq: Every day | ORAL | 5 refills | Status: DC

## 2020-06-25 IMAGING — DX CHEST - 2 VIEW
2 series · 2 of 2 positions shown · non-contrast
Comparison: 02/17/2018

CLINICAL DATA: Persistent cough

EXAM:
CHEST - 2 VIEW

[chest ap]
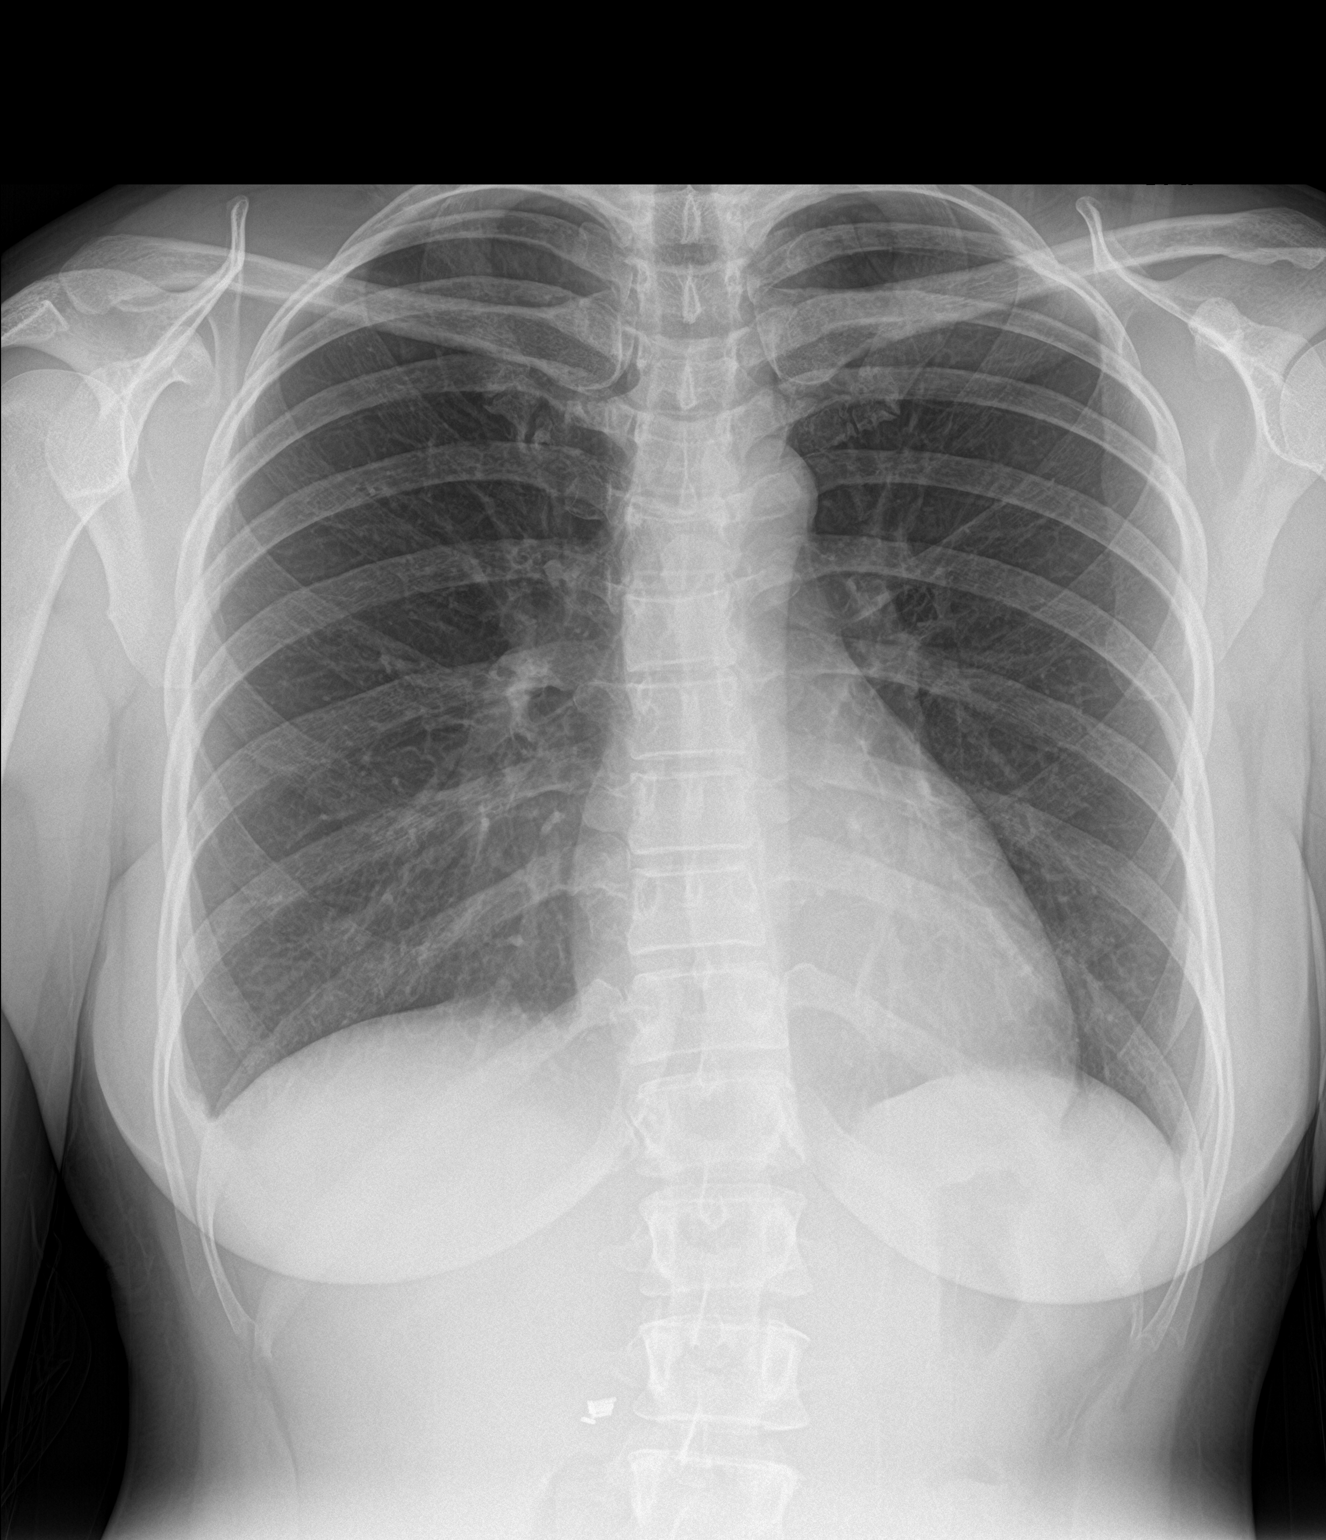

[chest lat]
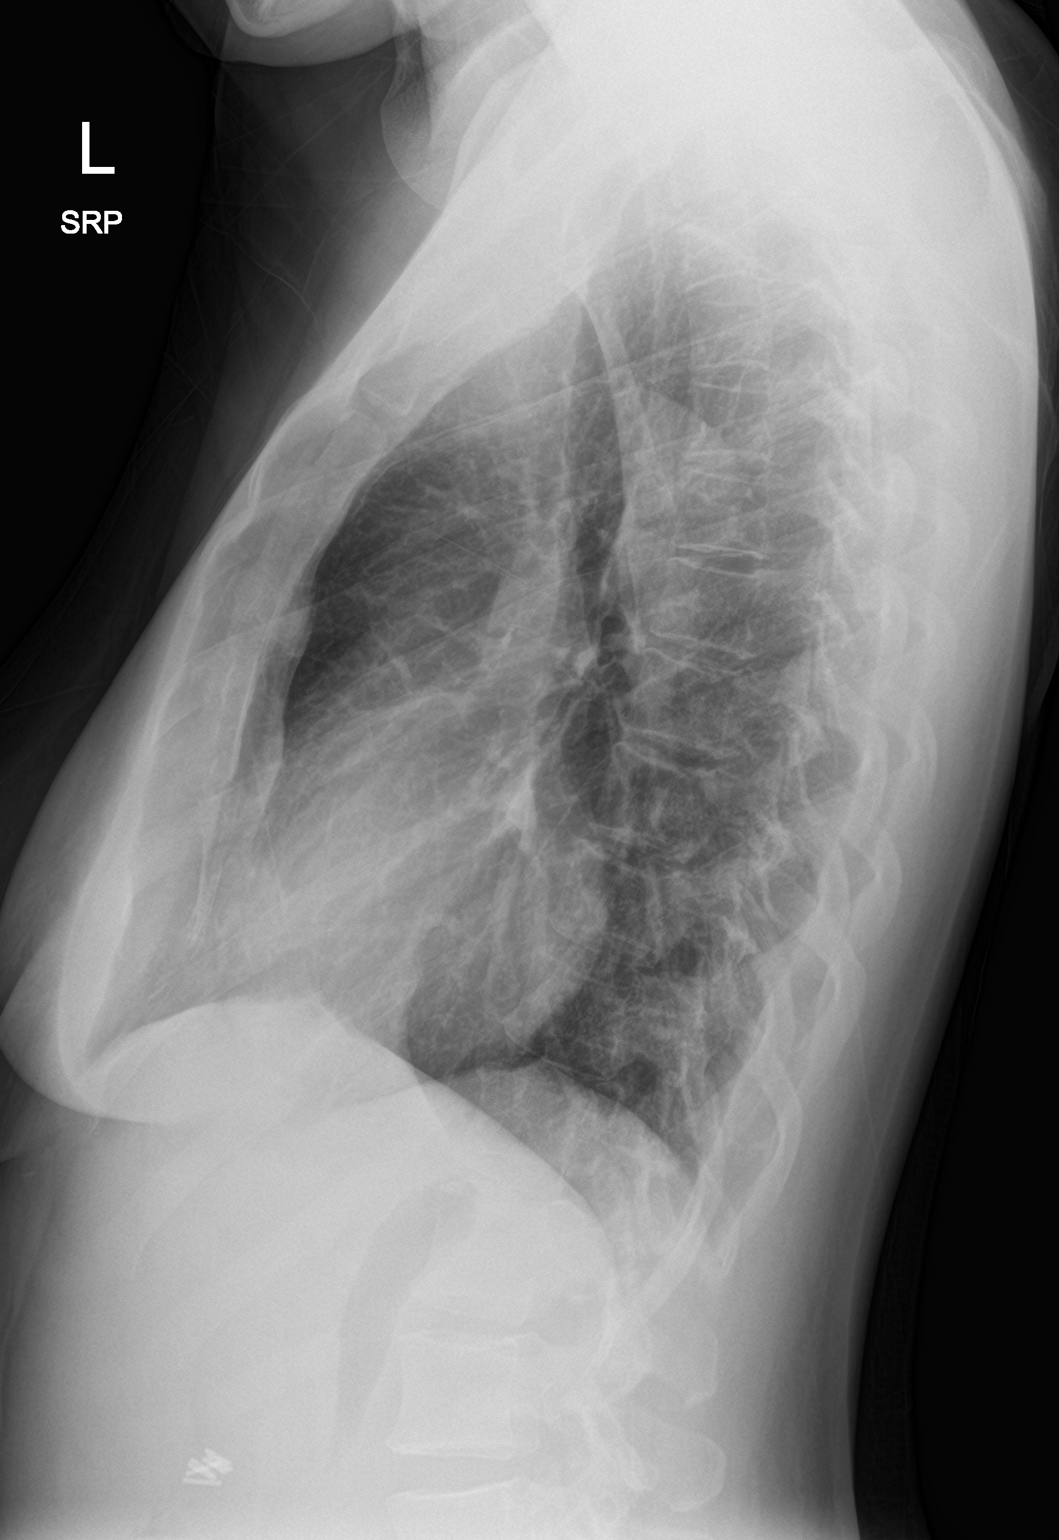

[2 of 2 positions shown; findings below may reference images not displayed]

FINDINGS: Heart size and vascularity normal. Interval development of small
bilateral pleural effusions. Negative for edema. Negative for
pneumonia. No focal consolidation.
IMPRESSION: Interval development of small bilateral pleural effusions without
evidence of heart failure or pneumonia.

## 2020-07-20 ENCOUNTER — Emergency Department (HOSPITAL_COMMUNITY)
Admission: EM | Admit: 2020-07-20 | Discharge: 2020-07-20 | Disposition: A | Discharge status: Home / Self Care | Payer: BC Managed Care – PPO | Attending: Emergency Medicine | Admitting: Emergency Medicine

## 2020-07-20 ENCOUNTER — Other Ambulatory Visit: Payer: Self-pay

## 2020-07-20 DIAGNOSIS — M79641 Pain in right hand: Secondary | ICD-10-CM

## 2020-07-20 DIAGNOSIS — M79644 Pain in right finger(s): Secondary | ICD-10-CM | POA: Diagnosis not present

## 2020-07-20 DIAGNOSIS — G8929 Other chronic pain: Secondary | ICD-10-CM | POA: Diagnosis not present

## 2020-07-20 DIAGNOSIS — Z87891 Personal history of nicotine dependence: Secondary | ICD-10-CM | POA: Insufficient documentation

## 2020-07-20 MED ORDER — PREDNISONE 20 MG PO TABS
60.0000 mg | ORAL_TABLET | Freq: Once | ORAL | Status: AC
  Administered 2020-07-20: 60 mg via ORAL
  Filled 2020-07-20: qty 3

## 2020-07-20 MED ORDER — IBUPROFEN 400 MG PO TABS
600.0000 mg | ORAL_TABLET | Freq: Once | ORAL | Status: AC
  Administered 2020-07-20: 600 mg via ORAL
  Filled 2020-07-20: qty 1

## 2020-07-20 MED ORDER — PREDNISONE 10 MG PO TABS: 40.0000 mg | ORAL_TABLET | Freq: Every day | ORAL | 0 refills | Status: AC

## 2020-07-20 NOTE — ED Triage Notes (Signed)
Pt reports pain in the pointer finger of right hand Xmonths.  The last two days pain has been unmanageable.

## 2020-07-20 NOTE — ED Provider Notes (Signed)
[facility] EMERGENCY DEPARTMENT Provider Note   CSN: [id number] Arrival date & time: 07/20/20  2109     History Chief Complaint  Patient presents with  . Hand Pain    Kathryn Norman is a 39 y.o. female.   Hand Pain This is a chronic problem. The current episode started more than 1 week ago. The problem occurs constantly. The problem has been gradually worsening. Pertinent negatives include no chest pain, no headaches and no shortness of breath. Exacerbated by: movement. Relieved by: rest. Treatments tried: ace wrap. The treatment provided mild relief.       Past Medical History:  Diagnosis Date  . ADHD (attention deficit hyperactivity disorder)   . Anemia   . Anxiety and depression 06/19/2012  . Asthma   . Chlamydia infection 10/18/2014   Treated  . Depression   . Hemorrhagic ovarian cyst 09/07/2012  . Migraines     Patient Active Problem List   Diagnosis Date Noted  . Overactive bladder 01/17/2020  . Amenorrhea 12/08/2018  . Migraine with aura and with status migrainosus, not intractable 11/23/2018  . Bipolar disorder, unspecified (HCC) 11/23/2018  . Visual hallucination 11/23/2018  . Major depressive disorder 11/23/2018  . Pap smear of cervix on 10/18/2014 shows high risk HPV present 06/25/2018  . Anxiety and depression 06/19/2012  . Chronic headaches 06/19/2012    Past Surgical History:  Procedure Laterality Date  . CHOLECYSTECTOMY    . DILATION AND CURETTAGE OF UTERUS     x 2     OB History    Gravida  6   Para  3   Term  2   Preterm  1   AB  3   Living  3     SAB  3   TAB      Ectopic      Multiple      Live Births              Family History  Problem Relation Age of Onset  . Migraines Mother   . Alopecia Mother   . Sickle cell trait Mother   . Cancer Father        died of lung cancer  . Sickle cell anemia Sister   . Seizures Brother     Social History   Tobacco Use  . Smoking status: Former  Smoker    Packs/day: 0.10    Types: Cigarettes    Start date: 10/21/2011  . Smokeless tobacco: Never Used  Vaping Use  . Vaping Use: Never used  Substance Use Topics  . Alcohol use: No    Alcohol/week: 0.0 standard drinks  . Drug use: No    Home Medications Prior to Admission medications   Medication Sig Start Date End Date Taking? Authorizing Provider  albuterol (PROAIR HFA) 108 (90 Base) MCG/ACT inhaler Inhale 1-2 puffs into the lungs every 6 (six) hours as needed for wheezing or shortness of breath. 12/01/19   Moshe Cipro, NP  albuterol (PROVENTIL) (2.5 MG/3ML) 0.083% nebulizer solution Take 3 mLs (2.5 mg total) by nebulization every 6 (six) hours as needed for wheezing or shortness of breath. 03/31/19   Linus Mako B, NP  ARIPiprazole (ABILIFY) 10 MG tablet Take 10 mg by mouth daily.    [provider]  atomoxetine (STRATTERA) 40 MG capsule Take 40 mg by mouth daily.    [provider]  beclomethasone (QVAR REDIHALER) 80 MCG/ACT inhaler Inhale 1 puff into the lungs 2 (two) times  daily. 04/02/19   Eustace Moore, MD  beclomethasone (QVAR) 40 MCG/ACT inhaler Inhale 1 puff into the lungs 2 (two) times daily before a meal. 03/31/19   Burky, Barron Alvine, NP  diazepam (VALIUM) 5 MG tablet Take 1 tablet (5 mg total) by mouth at bedtime as needed for muscle spasms. 05/28/19   Eustace Moore, MD  diclofenac (VOLTAREN) 75 MG EC tablet Take 1 tablet (75 mg total) by mouth 2 (two) times daily. 05/28/19   Eustace Moore, MD  ibuprofen (ADVIL) 800 MG tablet TAKE 1 TABLET BY MOUTH EVERY 8 HOURS AS NEEDED 06/11/19   Glyn Ade, Scot Jun, PA-C  medroxyPROGESTERone (PROVERA) 10 MG tablet Take 1 tablet (10 mg total) by mouth daily. Use for ten days 06/22/20   Constant, Peggy, MD  megestrol (MEGACE) 40 MG tablet TAKE 1 TABLET BY MOUTH TWICE A DAY 12/24/19   Anyanwu, Jethro Bastos, MD  mirtazapine (REMERON) 15 MG tablet Take 15 mg by mouth at bedtime.    [provider]    montelukast (SINGULAIR) 10 MG tablet Take 1 tablet (10 mg total) by mouth at bedtime. 12/01/19 12/31/19  Moshe Cipro, NP  predniSONE (DELTASONE) 10 MG tablet Take 4 tablets (40 mg total) by mouth daily for 4 days. 07/20/20 07/24/20  Sabino Donovan, MD  SUMAtriptan (IMITREX) 100 MG tablet Take 1 tablet (100 mg total) by mouth once as needed for up to 1 dose for migraine. May repeat in 2 hours if headache persists or recurs. 11/23/18   Glyn Ade, Scot Jun, PA-C  tolterodine (DETROL LA) 4 MG 24 hr capsule Take 1 capsule (4 mg total) by mouth daily. 01/17/20   Anyanwu, Jethro Bastos, MD  traZODone (DESYREL) 50 MG tablet Take 50 mg by mouth at bedtime.    [provider]  cetirizine (ZYRTEC) 10 MG tablet Take 1 tablet (10 mg total) by mouth daily. 03/31/19 05/28/19  Georgetta Haber, NP  megestrol (MEGACE) 40 MG tablet Take 1 tablet (40 mg total) by mouth 2 (two) times daily. Patient not taking: Reported on 03/23/2019 12/21/18   Tereso Newcomer, MD    Allergies    Methocarbamol, Acetaminophen, and Red dye  Review of Systems   Review of Systems  Constitutional: Negative for chills and fever.  HENT: Negative for congestion and rhinorrhea.   Respiratory: Negative for cough and shortness of breath.   Cardiovascular: Negative for chest pain and palpitations.  Gastrointestinal: Negative for diarrhea, nausea and vomiting.  Genitourinary: Negative for difficulty urinating and dysuria.  Musculoskeletal: Positive for arthralgias. Negative for back pain and joint swelling.  Skin: Negative for color change, rash and wound.  Neurological: Negative for light-headedness and headaches.    Physical Exam Updated Vital Signs BP 133/81 (BP Location: Left Arm)   Pulse 74   Temp 98.2 F (36.8 C) (Oral)   Resp 18   SpO2 100%   Physical Exam Vitals and nursing note reviewed. Exam conducted with a chaperone present.  Constitutional:      General: She is not in acute distress.    Appearance: Normal  appearance.  HENT:     Head: Normocephalic and atraumatic.     Nose: No rhinorrhea.  Eyes:     General:        Right eye: No discharge.        Left eye: No discharge.     Conjunctiva/sclera: Conjunctivae normal.  Cardiovascular:     Rate and Rhythm: Normal rate and regular rhythm.  Pulmonary:     Effort: Pulmonary effort is normal. No respiratory distress.     Breath sounds: No stridor.  Abdominal:     General: Abdomen is flat. There is no distension.     Palpations: Abdomen is soft.  Musculoskeletal:        General: Tenderness present. No swelling, deformity or signs of injury.     Comments: Decreased range of motion due to pain of the right index finger at the first MCP.  No color change no swelling, no warmth.  No signs of trauma.  Cap refill present sensation intact motor function intact  Skin:    General: Skin is warm and dry.  Neurological:     General: No focal deficit present.     Mental Status: She is alert. Mental status is at baseline.     Motor: No weakness.  Psychiatric:        Mood and Affect: Mood normal.        Behavior: Behavior normal.     ED Results / Procedures / Treatments   Labs (all labs ordered are listed, but only abnormal results are displayed) Labs Reviewed - No data to display  EKG None  Radiology No results found.  Procedures Procedures (including critical care time)  Medications Ordered in ED Medications  predniSONE (DELTASONE) tablet 60 mg (60 mg Oral Given 07/20/20 2221)  ibuprofen (ADVIL) tablet 600 mg (600 mg Oral Given 07/20/20 2220)    ED Course  I have reviewed the triage vital signs and the nursing notes.  Pertinent labs & imaging results that were available during my care of the patient were reviewed by me and considered in my medical decision making (see chart for details).    MDM Rules/Calculators/A&P                            Chronic worsening right index finger pain, history of arthralgias of the feet and  shoulders.  Possibly rheumatologic, possibly gout.  Will give anti-inflammatories.  Will give recommendations for over-the-counter anti-inflammatories.  No imaging needed at this time as there is no history of trauma and is a chronic problem.  Hand referral given.  Strict return precautions provided Final Clinical Impression(s) / ED Diagnoses Final diagnoses:  Right hand pain    Rx / DC Orders ED Discharge Orders         Ordered    predniSONE (DELTASONE) 10 MG tablet  Daily        07/20/20 2224    Ambulatory referral to Hand Surgery       Comments: Dr. Merlyn Lot is on call   07/20/20 2227           Sabino Donovan, MD 07/20/20 2231

## 2020-07-20 NOTE — Discharge Instructions (Addendum)
You can take 600 mg of ibuprofen every 6 hours, you can take 1000 mg of Tylenol every 6 hours, you can alternate these every 3 or you can take them together.  

## 2020-08-02 DIAGNOSIS — Z7251 High risk heterosexual behavior: Secondary | ICD-10-CM | POA: Diagnosis not present

## 2020-08-02 DIAGNOSIS — M79641 Pain in right hand: Secondary | ICD-10-CM | POA: Diagnosis not present

## 2020-08-02 DIAGNOSIS — Z8619 Personal history of other infectious and parasitic diseases: Secondary | ICD-10-CM | POA: Diagnosis not present

## 2020-08-02 DIAGNOSIS — Z3200 Encounter for pregnancy test, result unknown: Secondary | ICD-10-CM | POA: Diagnosis not present

## 2020-08-02 DIAGNOSIS — Z79899 Other long term (current) drug therapy: Secondary | ICD-10-CM | POA: Diagnosis not present

## 2020-08-21 ENCOUNTER — Other Ambulatory Visit (HOSPITAL_COMMUNITY)
Admission: RE | Admit: 2020-08-21 | Discharge: 2020-08-21 | Disposition: A | Discharge status: Home / Self Care | Payer: BC Managed Care – PPO | Source: Ambulatory Visit | Attending: Obstetrics and Gynecology | Admitting: Obstetrics and Gynecology

## 2020-08-21 ENCOUNTER — Ambulatory Visit (INDEPENDENT_AMBULATORY_CARE_PROVIDER_SITE_OTHER): Payer: BC Managed Care – PPO | Admitting: Obstetrics and Gynecology

## 2020-08-21 ENCOUNTER — Other Ambulatory Visit: Payer: Self-pay

## 2020-08-21 ENCOUNTER — Encounter: Payer: Self-pay | Admitting: Obstetrics and Gynecology

## 2020-08-21 VITALS — BP 142/87 | HR 76 | Ht 63.0 in | Wt 121.6 lb

## 2020-08-21 DIAGNOSIS — Z01419 Encounter for gynecological examination (general) (routine) without abnormal findings: Secondary | ICD-10-CM

## 2020-08-21 MED ORDER — AZITHROMYCIN 500 MG PO TABS: 1000.0000 mg | ORAL_TABLET | Freq: Once | ORAL | 1 refills | Status: AC

## 2020-08-21 NOTE — Progress Notes (Signed)
Subjective:     Kathryn Norman is a 39 y.o. female P3 with LMP 07/07/20 who is here for a comprehensive physical exam. The patient reports no problems. She reports being late for her cycle which is not uncommon for her. She is sexually active and hoping to conceive. She reports some lower abdominal pain and the presence of a grey discharge. She was recently diagnosed with chlamydia with her PCP 2 weeks ago without treatment. Her partner has also not been treated.  Past Medical History:  Diagnosis Date  . ADHD (attention deficit hyperactivity disorder)   . Anemia   . Anxiety and depression 06/19/2012  . Asthma   . Chlamydia infection 10/18/2014   Treated  . Depression   . Hemorrhagic ovarian cyst 09/07/2012  . Migraines    Past Surgical History:  Procedure Laterality Date  . CHOLECYSTECTOMY    . DILATION AND CURETTAGE OF UTERUS     x 2   Family History  Problem Relation Age of Onset  . Migraines Mother   . Alopecia Mother   . Sickle cell trait Mother   . Cancer Father        died of lung cancer  . Sickle cell anemia Sister   . Seizures Brother     Social History   Socioeconomic History  . Marital status: Single    Spouse name: Not on file  . Number of children: Not on file  . Years of education: Not on file  . Highest education level: Not on file  Occupational History  . Not on file  Tobacco Use  . Smoking status: Former Smoker    Packs/day: 0.10    Types: Cigarettes    Start date: 10/21/2011  . Smokeless tobacco: Never Used  Vaping Use  . Vaping Use: Never used  Substance and Sexual Activity  . Alcohol use: No    Alcohol/week: 0.0 standard drinks  . Drug use: No  . Sexual activity: Yes    Partners: Male    Birth control/protection: None  Other Topics Concern  . Not on file  Social History Narrative  . Not on file   Social Determinants of Health   Financial Resource Strain: Not on file  Food Insecurity: Not on file  Transportation Needs: Not on  file  Physical Activity: Not on file  Stress: Not on file  Social Connections: Not on file  Intimate Partner Violence: Not on file   Health Maintenance  Topic Date Due  . COVID-19 Vaccine (1) Never done  . TETANUS/TDAP  Never done  . INFLUENZA VACCINE  Never done  . PAP SMEAR-Modifier  06/25/2021  . Hepatitis C Screening  Completed  . HIV Screening  Completed       Review of Systems Pertinent items noted in HPI and remainder of comprehensive ROS otherwise negative.   Objective:  Blood pressure (!) 142/87, pulse 76, height 5\' 3"  (1.6 m), weight 121 lb 9.6 oz (55.2 kg), last menstrual period 07/07/2020.     GENERAL: Well-developed, well-nourished female in no acute distress.  HEENT: Normocephalic, atraumatic. Sclerae anicteric.  NECK: Supple. Normal thyroid.  LUNGS: Clear to auscultation bilaterally.  HEART: Regular rate and rhythm. BREASTS: Symmetric in size. No palpable masses or lymphadenopathy, skin changes, or nipple drainage. ABDOMEN: Soft, nontender, nondistended. No organomegaly. PELVIC: Normal external female genitalia. Vagina is pink and rugated.  Normal discharge. Normal appearing cervix. Uterus is normal in size.  No adnexal mass or tenderness. EXTREMITIES: No cyanosis, clubbing, or edema,  2+ distal pulses.    Assessment:    Healthy female exam.      Plan:    Pap smear collected Screening mammogram ordered Treatment for chlamydia provided and patient advised to have her partner treated as well. They both need to abstain for 7 days Patient advised to take prenatal vitamins See After Visit Summary for Counseling Recommendations

## 2020-08-21 NOTE — Progress Notes (Signed)
Patient presents for AEX. Patient states that she had a positive chlamydia test mid December, but has not been treated.   Last Pap: 06/25/18 Normal

## 2020-08-24 LAB — CYTOLOGY - PAP
Comment: NEGATIVE
Diagnosis: NEGATIVE
Diagnosis: REACTIVE
High risk HPV: NEGATIVE

## 2020-09-08 DIAGNOSIS — Z20828 Contact with and (suspected) exposure to other viral communicable diseases: Secondary | ICD-10-CM | POA: Diagnosis not present

## 2020-09-08 DIAGNOSIS — B349 Viral infection, unspecified: Secondary | ICD-10-CM | POA: Diagnosis not present

## 2020-09-08 DIAGNOSIS — R509 Fever, unspecified: Secondary | ICD-10-CM | POA: Diagnosis not present

## 2020-11-02 ENCOUNTER — Ambulatory Visit: Payer: Self-pay

## 2020-11-02 ENCOUNTER — Other Ambulatory Visit: Payer: Self-pay

## 2020-11-02 ENCOUNTER — Other Ambulatory Visit: Payer: Self-pay | Admitting: Family Medicine

## 2020-11-02 DIAGNOSIS — M79642 Pain in left hand: Secondary | ICD-10-CM

## 2020-11-02 DIAGNOSIS — M79641 Pain in right hand: Secondary | ICD-10-CM

## 2020-11-08 ENCOUNTER — Other Ambulatory Visit: Payer: Self-pay

## 2020-11-08 ENCOUNTER — Ambulatory Visit (INDEPENDENT_AMBULATORY_CARE_PROVIDER_SITE_OTHER): Payer: BC Managed Care – PPO

## 2020-11-08 ENCOUNTER — Other Ambulatory Visit (HOSPITAL_COMMUNITY)
Admission: RE | Admit: 2020-11-08 | Discharge: 2020-11-08 | Disposition: A | Discharge status: Home / Self Care | Payer: BC Managed Care – PPO | Source: Ambulatory Visit | Attending: Obstetrics | Admitting: Obstetrics

## 2020-11-08 VITALS — BP 131/88 | HR 88

## 2020-11-08 DIAGNOSIS — Z113 Encounter for screening for infections with a predominantly sexual mode of transmission: Secondary | ICD-10-CM | POA: Diagnosis not present

## 2020-11-08 NOTE — Progress Notes (Signed)
SUBJECTIVE:  40 y.o. female complains of vaginal discharge for a couple of days.. Denies abnormal vaginal bleeding or significant pelvic pain or fever. No UTI symptoms. Denies history of known exposure to STD.  No LMP recorded.  OBJECTIVE:  She appears well, afebrile. Urine dipstick: not done     ASSESSMENT:  Vaginal Discharge  Vaginal Odor: small amount    PLAN:  GC, chlamydia, trichomonas,  probe sent to lab. Treatment: To be determined once lab results are received ROV prn if symptoms persist or worsen.

## 2020-11-09 LAB — CERVICOVAGINAL ANCILLARY ONLY
Chlamydia: NEGATIVE
Comment: NEGATIVE
Comment: NEGATIVE
Comment: NORMAL
Neisseria Gonorrhea: NEGATIVE
Trichomonas: NEGATIVE

## 2020-11-22 ENCOUNTER — Encounter: Payer: Self-pay | Admitting: Women's Health

## 2020-11-22 ENCOUNTER — Other Ambulatory Visit: Payer: Self-pay

## 2020-11-22 ENCOUNTER — Ambulatory Visit (INDEPENDENT_AMBULATORY_CARE_PROVIDER_SITE_OTHER): Payer: BC Managed Care – PPO | Admitting: Women's Health

## 2020-11-22 VITALS — BP 130/88 | HR 59 | Ht 63.0 in | Wt 129.0 lb

## 2020-11-22 DIAGNOSIS — Z3042 Encounter for surveillance of injectable contraceptive: Secondary | ICD-10-CM

## 2020-11-22 LAB — POCT URINE PREGNANCY: Preg Test, Ur: NEGATIVE

## 2020-11-22 MED ORDER — MEDROXYPROGESTERONE ACETATE 150 MG/ML IM SUSP
150.0000 mg | Freq: Once | INTRAMUSCULAR | Status: AC
  Administered 2020-11-22: 150 mg via INTRAMUSCULAR

## 2020-11-22 NOTE — Progress Notes (Signed)
  History:  Ms. Kathryn Norman is a 40 y.o. [id number] who presents to clinic today for birth control consult. Patient has used the patch and NuvaRing in the past with success, but prefers the patch at this time. Patient has MWA, overactive bladder, anxiety and depression, bipolar, asthma. Patient denies any other diagnosed medical conditions. Advised with MWA cannot use estrogen-containing birth control due to elevated risk of stroke. Discussed progestin-only methods.  Patient reports LMP was 11/10/2020, pt reports unprotected intercourse on 11/18/2020 and took PlanB on 11/19/2020.  Patient is allergic to methocarbamol, Tylenol, latex and red dye. Patient is currently taking: Singulair, Symbicort, albuterol, Remeron (for sleep and weight gain), VitaminD  Patient reports she has used Depo in the past, but reports she had period-like bleeding for the first three months and she did not go back for a second injection. Patient is not interested in Nexplanon because she does not like the thought of feeling something underneath her skin. Patient also had a Mirena IUD that expelled itself, but then patient got another IUD inserted for 5 years. Patient enjoyed the Mirena because it made her period very light, and she did not have to think about doing something each day.  The following portions of the patient's history were reviewed and updated as appropriate: allergies, current medications, family history, past medical history, social history, past surgical history and problem list.  Review of Systems:  Review of Systems  Constitutional: Negative for chills, fever and weight loss.  Genitourinary: Negative for dysuria.  Neurological: Negative for headaches.     Objective:  Physical Exam  BP 130/88   Pulse (!) 59   Ht 5\' 3"  (1.6 m)   Wt 129 lb (58.5 kg)   LMP 11/10/2020   BMI 22.85 kg/m    Physical Exam Vitals and nursing note reviewed.  Constitutional:      General: She is not in acute  distress.    Appearance: Normal appearance. She is not ill-appearing, toxic-appearing or diaphoretic.  HENT:     Head: Normocephalic and atraumatic.  Pulmonary:     Effort: Pulmonary effort is normal.  Neurological:     Mental Status: She is alert and oriented to person, place, and time.  Psychiatric:        Mood and Affect: Mood normal.        Behavior: Behavior normal.        Thought Content: Thought content normal.        Judgment: Judgment normal.    Labs and Imaging Results for orders placed or performed in visit on 11/22/20 (from the past 24 hour(s))  POCT urine pregnancy     Status: None   Collection Time: 11/22/20  3:08 PM  Result Value Ref Range   Preg Test, Ur Negative Negative    No results found.  Assessment & Plan:   1. Encounter for Depo-Provera contraception - Pt advised given unprotected intercourse and chance of pregnancy, if pregnancy test is negative can do Depo today, but will need to wait for IUD insertion. Can schedule IUD insertion around time next period is supposed to begin, and can do Depo today if she would like protection sooner. Pt verbalizes understanding and agrees with plan. - POCT urine pregnancy - negative - medroxyPROGESTERone (DEPO-PROVERA) injection 150 mg  Approximately 10 minutes of total time was spent with this patient on counseling and coordination of care.  11/24/20, NP 11/22/2020 3:23 PM

## 2020-11-22 NOTE — Patient Instructions (Addendum)
[email or web address] for birth control       Levonorgestrel intrauterine device (IUD) What is this medicine? LEVONORGESTREL IUD (LEE voe nor jes trel) is a contraceptive (birth control) device. The device is placed inside the uterus by a health care provider. It is used to prevent pregnancy. Some devices can also be used to treat heavy bleeding that occurs during your period. This medicine may be used for other purposes; ask your health care provider or pharmacist if you have questions. COMMON BRAND NAME(S): Cameron Ali What should I tell my health care provider before I take this medicine? They need to know if you have any of these conditions:  abnormal Pap smear  cancer of the breast, uterus, or cervix  diabetes  endometritis  genital or pelvic infection now or in the past  have more than one sexual partner or your partner has more than one partner  heart disease  history of an ectopic or tubal pregnancy  immune system problems  IUD in place  liver disease or tumor  problems with blood clots or take blood-thinners  seizures  use intravenous drugs  uterus of unusual shape  vaginal bleeding that has not been explained  an unusual or allergic reaction to levonorgestrel, other hormones, silicone, or polyethylene, medicines, foods, dyes, or preservatives  pregnant or trying to get pregnant  breast-feeding How should I use this medicine? This device is placed inside the uterus by a health care professional. Talk to your pediatrician regarding the use of this medicine in children. Special care may be needed. Overdosage: If you think you have taken too much of this medicine contact a poison control center or emergency room at once. NOTE: This medicine is only for you. Do not share this medicine with others. What if I miss a dose? This does not apply. Depending on the brand of device you have inserted, the device will need to be replaced every 3 to 7  years if you wish to continue using this type of birth control. What may interact with this medicine? Do not take this medicine with any of the following medications:  amprenavir  bosentan  fosamprenavir This medicine may also interact with the following medications:  aprepitant  armodafinil  barbiturate medicines for inducing sleep or treating seizures  bexarotene  boceprevir  griseofulvin  medicines to treat seizures like carbamazepine, ethotoin, felbamate, oxcarbazepine, phenytoin, topiramate  modafinil  pioglitazone  rifabutin  rifampin  rifapentine  some medicines to treat HIV infection like atazanavir, efavirenz, indinavir, lopinavir, nelfinavir, tipranavir, ritonavir  St. John's wort  warfarin This list may not describe all possible interactions. Give your health care provider a list of all the medicines, herbs, non-prescription drugs, or dietary supplements you use. Also tell them if you smoke, drink alcohol, or use illegal drugs. Some items may interact with your medicine. What should I watch for while using this medicine? Visit your doctor or health care professional for regular check ups. See your doctor if you or your partner has sexual contact with others, becomes HIV positive, or gets a sexual transmitted disease. This product does not protect you against HIV infection (AIDS) or other sexually transmitted diseases. You can check the placement of the IUD yourself by reaching up to the top of your vagina with clean fingers to feel the threads. Do not pull on the threads. It is a good habit to check placement after each menstrual period. Call your doctor right away if you feel more of the IUD than  just the threads or if you cannot feel the threads at all. The IUD may come out by itself. You may become pregnant if the device comes out. If you notice that the IUD has come out use a backup birth control method like condoms and call your health care  provider. Using tampons will not change the position of the IUD and are okay to use during your period. This IUD can be safely scanned with magnetic resonance imaging (MRI) only under specific conditions. Before you have an MRI, tell your healthcare provider that you have an IUD in place, and which type of IUD you have in place. What side effects may I notice from receiving this medicine? Side effects that you should report to your doctor or health care professional as soon as possible:  allergic reactions like skin rash, itching or hives, swelling of the face, lips, or tongue  fever, flu-like symptoms  genital sores  high blood pressure  no menstrual period for 6 weeks during use  pain, swelling, warmth in the leg  pelvic pain or tenderness  severe or sudden headache  signs of pregnancy  stomach cramping  sudden shortness of breath  trouble with balance, talking, or walking  unusual vaginal bleeding, discharge  yellowing of the eyes or skin Side effects that usually do not require medical attention (report to your doctor or health care professional if they continue or are bothersome):  acne  breast pain  change in sex drive or performance  changes in weight  cramping, dizziness, or faintness while the device is being inserted  headache  irregular menstrual bleeding within first 3 to 6 months of use  nausea This list may not describe all possible side effects. Call your doctor for medical advice about side effects. You may report side effects to FDA at 1-800-FDA-1088. Where should I keep my medicine? This does not apply. NOTE: This sheet is a summary. It may not cover all possible information. If you have questions about this medicine, talk to your doctor, pharmacist, or health care provider.  2021 Elsevier/Gold Standard (2020-04-11 16:27:45)       Medroxyprogesterone injection [Contraceptive] What is this medicine? MEDROXYPROGESTERONE (me DROX ee proe  JES te rone) contraceptive injections prevent pregnancy. They provide effective birth control for 3 months. Depo-SubQ Provera 104 injection is also used for treating pain related to endometriosis. This medicine may be used for other purposes; ask your health care provider or pharmacist if you have questions. COMMON BRAND NAME(S): Depo-Provera, Depo-subQ Provera 104 What should I tell my health care provider before I take this medicine? They need to know if you have any of these conditions:  asthma  blood clots  breast cancer or family history of breast cancer  depression  diabetes  eating disorder (anorexia nervosa)  heart attack  high blood pressure  HIV infection or AIDS  if you often drink alcohol  kidney disease  liver disease  migraine headaches  osteoporosis, weak bones  seizures  stroke  tobacco smoker  vaginal bleeding  an unusual or allergic reaction to medroxyprogesterone, other hormones, medicines, foods, dyes, or preservatives  pregnant or trying to get pregnant  breast-feeding How should I use this medicine? Depo-Provera CI contraceptive injection is given into a muscle. Depo-subQ Provera 104 injection is given under the skin. It is given by a health care provider in a hospital or clinic setting. The injection is usually given during the first 5 days after the start of a menstrual period or  6 weeks after delivery of a baby. A patient package insert for the product will be given with each prescription and refill. Be sure to read this information carefully each time. The sheet may change often. Talk to your pediatrician regarding the use of this medicine in children. Special care may be needed. These injections have been used in female children who have started having menstrual periods. Overdosage: If you think you have taken too much of this medicine contact a poison control center or emergency room at once. NOTE: This medicine is only for you. Do not  share this medicine with others. What if I miss a dose? Keep appointments for follow-up doses. You must get an injection once every 3 months. It is important not to miss your dose. Call your health care provider if you are unable to keep an appointment. What may interact with this medicine?  antibiotics or medicines for infections, especially rifampin and griseofulvin  antivirals for HIV or hepatitis  aprepitant  armodafinil  bexarotene  bosentan  medicines for seizures like carbamazepine, felbamate, oxcarbazepine, phenytoin, phenobarbital, primidone, topiramate  mitotane  modafinil  St. John's wort This list may not describe all possible interactions. Give your health care provider a list of all the medicines, herbs, non-prescription drugs, or dietary supplements you use. Also tell them if you smoke, drink alcohol, or use illegal drugs. Some items may interact with your medicine. What should I watch for while using this medicine? This drug does not protect you against HIV infection (AIDS) or other sexually transmitted diseases. Use of this product may cause you to lose calcium from your bones. Loss of calcium may cause weak bones (osteoporosis). Only use this product for more than 2 years if other forms of birth control are not right for you. The longer you use this product for birth control the more likely you will be at risk for weak bones. Ask your health care professional how you can keep strong bones. You may have a change in bleeding pattern or irregular periods. Many females stop having periods while taking this drug. If you have received your injections on time, your chance of being pregnant is very low. If you think you may be pregnant, see your health care professional as soon as possible. Tell your health care professional if you want to get pregnant within the next year. The effect of this medicine may last a long time after you get your last injection. What side effects  may I notice from receiving this medicine? Side effects that you should report to your doctor or health care professional as soon as possible:  allergic reactions like skin rash, itching or hives, swelling of the face, lips, or tongue  blood clot (chest pain; shortness of breath; pain, swelling, or warmth in the leg)  breast tenderness or discharge  changes in emotions or moods  changes in vision  liver injury (dark yellow or brown urine; general ill feeling or flu-like symptoms; loss of appetite, right upper belly pain; unusually weak or tired, yellowing of the eyes or skin)  persistent pain, pus, or bleeding at the injection site  stroke (changes in vision; confusion; trouble speaking or understanding; severe headaches; sudden numbness or weakness of the face, arm or leg; trouble walking; dizziness; loss of balance or coordination)  trouble breathing Side effects that usually do not require medical attention (report to your doctor or health care professional if they continue or are bothersome):  change in sex drive  dizziness  fluid  retention  headache  irregular periods, spotting, or absent periods  pain, redness, or irritation at site where injected  stomach pain  weight gain This list may not describe all possible side effects. Call your doctor for medical advice about side effects. You may report side effects to FDA at 1-800-FDA-1088. Where should I keep my medicine? This injection is only given by a health care provider. It will not be stored at home. NOTE: This sheet is a summary. It may not cover all possible information. If you have questions about this medicine, talk to your doctor, pharmacist, or health care provider.  2021 Elsevier/Gold Standard (2019-09-29 10:29:21)

## 2020-11-22 NOTE — Progress Notes (Signed)
RGYN patient presents for visit to discuss contraception options.pt considering patch or Nuvaring. Pt has experience with both.    LMP: 11/10/20-11/16/20 . Cycles usually last 3-4 days moderate flow.  Last had unprotected intercourse this past weekend took Plan B Sunday Morning.

## 2020-12-12 ENCOUNTER — Ambulatory Visit: Payer: BC Managed Care – PPO

## 2020-12-19 ENCOUNTER — Encounter: Payer: Self-pay | Admitting: Obstetrics

## 2020-12-19 ENCOUNTER — Other Ambulatory Visit: Payer: Self-pay

## 2020-12-19 ENCOUNTER — Ambulatory Visit (INDEPENDENT_AMBULATORY_CARE_PROVIDER_SITE_OTHER): Payer: BC Managed Care – PPO | Admitting: Obstetrics

## 2020-12-19 VITALS — BP 134/90 | HR 65 | Wt 124.0 lb

## 2020-12-19 DIAGNOSIS — Z30430 Encounter for insertion of intrauterine contraceptive device: Secondary | ICD-10-CM

## 2020-12-19 DIAGNOSIS — N898 Other specified noninflammatory disorders of vagina: Secondary | ICD-10-CM

## 2020-12-19 LAB — POCT URINE PREGNANCY: Preg Test, Ur: NEGATIVE

## 2020-12-19 MED ORDER — LEVONORGESTREL 20 MCG/24HR IU IUD: INTRAUTERINE_SYSTEM | Freq: Once | INTRAUTERINE | Status: AC

## 2020-12-19 MED ORDER — METRONIDAZOLE 500 MG PO TABS: 500.0000 mg | ORAL_TABLET | Freq: Two times a day (BID) | ORAL | 2 refills | Status: DC

## 2020-12-19 NOTE — Progress Notes (Signed)
IUD Procedure Note   DIAGNOSIS: Desires long-term, reversible contraception ( LARC )  PROCEDURE: IUD placement Performing Provider: Brock Bad MD Patient counseled prior to procedure. I explained risks and benefits of Mirena IUD, reviewed alternative forms of contraception. Patient stated understanding and consented to continue with procedure.   LMP: 11-22-2020 Pregnancy Test: Negative Lot #: [id number] Expiration Date: 2024 / JUN   IUD type: [X]  Mirena   [) Paragard  []  Lyletta   []   Kyleena  PROCEDURE:  Timeout procedure was performed to ensure right patient and right site.  A bimanual exam was performed to determine the position of the uterus, . The speculum was placed. The vagina and cervix was sterilized in the usual manner and sterile technique was maintained throughout the course of the procedure. A single toothed tenaculum was applied to the anterior lip of the cervix and gentle traction applied. The depth of the uterus was sounded to 7. With gentle traction on the tenaculum, the IUD was inserted to the appropriate depth and inserted without difficulty.  The string was cut to an estimated 4 cm length. Bleeding was minimal. The patient tolerated the procedure well.   Follow up: The patient tolerated the procedure well without complications.  Standard post-procedure care is explained and return precautions are given.  , MD 12/19/2020 4:12 PM

## 2020-12-19 NOTE — Addendum Note (Signed)
Addended by: Marya Landry D on: 12/19/2020 05:07 PM   Modules accepted: Orders

## 2020-12-29 ENCOUNTER — Other Ambulatory Visit: Payer: Self-pay | Admitting: Obstetrics and Gynecology

## 2020-12-29 DIAGNOSIS — Z1231 Encounter for screening mammogram for malignant neoplasm of breast: Secondary | ICD-10-CM

## 2021-01-25 ENCOUNTER — Ambulatory Visit (HOSPITAL_COMMUNITY): Payer: BC Managed Care – PPO | Attending: Obstetrics

## 2021-01-31 ENCOUNTER — Ambulatory Visit: Payer: BC Managed Care – PPO | Admitting: Obstetrics

## 2021-02-02 ENCOUNTER — Ambulatory Visit: Payer: BC Managed Care – PPO | Admitting: Obstetrics

## 2021-02-02 ENCOUNTER — Other Ambulatory Visit: Payer: Self-pay

## 2021-02-04 NOTE — Patient Instructions (Signed)
Appointment cancelled

## 2021-02-13 ENCOUNTER — Ambulatory Visit (HOSPITAL_COMMUNITY): Payer: BC Managed Care – PPO | Attending: Obstetrics

## 2021-02-20 ENCOUNTER — Encounter: Payer: Self-pay | Admitting: Obstetrics

## 2021-02-20 ENCOUNTER — Ambulatory Visit: Payer: BC Managed Care – PPO | Admitting: Obstetrics

## 2021-02-22 ENCOUNTER — Inpatient Hospital Stay: Admission: RE | Admit: 2021-02-22 | Payer: BC Managed Care – PPO | Source: Ambulatory Visit

## 2021-10-25 DIAGNOSIS — M25512 Pain in left shoulder: Secondary | ICD-10-CM | POA: Diagnosis not present

## 2021-10-30 DIAGNOSIS — M25512 Pain in left shoulder: Secondary | ICD-10-CM | POA: Diagnosis not present

## 2021-10-30 DIAGNOSIS — M6281 Muscle weakness (generalized): Secondary | ICD-10-CM | POA: Diagnosis not present

## 2021-10-30 DIAGNOSIS — S46012D Strain of muscle(s) and tendon(s) of the rotator cuff of left shoulder, subsequent encounter: Secondary | ICD-10-CM | POA: Diagnosis not present

## 2021-11-02 DIAGNOSIS — M6281 Muscle weakness (generalized): Secondary | ICD-10-CM | POA: Diagnosis not present

## 2021-11-02 DIAGNOSIS — S46012D Strain of muscle(s) and tendon(s) of the rotator cuff of left shoulder, subsequent encounter: Secondary | ICD-10-CM | POA: Diagnosis not present

## 2021-11-02 DIAGNOSIS — M25512 Pain in left shoulder: Secondary | ICD-10-CM | POA: Diagnosis not present

## 2021-11-06 DIAGNOSIS — M25512 Pain in left shoulder: Secondary | ICD-10-CM | POA: Diagnosis not present

## 2021-11-06 DIAGNOSIS — S46012D Strain of muscle(s) and tendon(s) of the rotator cuff of left shoulder, subsequent encounter: Secondary | ICD-10-CM | POA: Diagnosis not present

## 2021-11-06 DIAGNOSIS — M6281 Muscle weakness (generalized): Secondary | ICD-10-CM | POA: Diagnosis not present

## 2021-11-09 DIAGNOSIS — M25511 Pain in right shoulder: Secondary | ICD-10-CM | POA: Diagnosis not present

## 2021-11-09 DIAGNOSIS — M542 Cervicalgia: Secondary | ICD-10-CM | POA: Diagnosis not present

## 2021-11-12 DIAGNOSIS — S46012D Strain of muscle(s) and tendon(s) of the rotator cuff of left shoulder, subsequent encounter: Secondary | ICD-10-CM | POA: Diagnosis not present

## 2021-11-12 DIAGNOSIS — M25512 Pain in left shoulder: Secondary | ICD-10-CM | POA: Diagnosis not present

## 2021-11-12 DIAGNOSIS — M6281 Muscle weakness (generalized): Secondary | ICD-10-CM | POA: Diagnosis not present

## 2021-11-16 DIAGNOSIS — S46012D Strain of muscle(s) and tendon(s) of the rotator cuff of left shoulder, subsequent encounter: Secondary | ICD-10-CM | POA: Diagnosis not present

## 2021-11-16 DIAGNOSIS — M25572 Pain in left ankle and joints of left foot: Secondary | ICD-10-CM | POA: Diagnosis not present

## 2021-11-16 DIAGNOSIS — M25571 Pain in right ankle and joints of right foot: Secondary | ICD-10-CM | POA: Diagnosis not present

## 2021-11-16 DIAGNOSIS — M25512 Pain in left shoulder: Secondary | ICD-10-CM | POA: Diagnosis not present

## 2021-11-16 DIAGNOSIS — M79642 Pain in left hand: Secondary | ICD-10-CM | POA: Diagnosis not present

## 2021-11-16 DIAGNOSIS — M79641 Pain in right hand: Secondary | ICD-10-CM | POA: Diagnosis not present

## 2021-11-16 DIAGNOSIS — M6281 Muscle weakness (generalized): Secondary | ICD-10-CM | POA: Diagnosis not present

## 2021-11-21 DIAGNOSIS — S46012D Strain of muscle(s) and tendon(s) of the rotator cuff of left shoulder, subsequent encounter: Secondary | ICD-10-CM | POA: Diagnosis not present

## 2021-11-21 DIAGNOSIS — M25512 Pain in left shoulder: Secondary | ICD-10-CM | POA: Diagnosis not present

## 2021-11-21 DIAGNOSIS — M6281 Muscle weakness (generalized): Secondary | ICD-10-CM | POA: Diagnosis not present

## 2021-11-26 DIAGNOSIS — M6281 Muscle weakness (generalized): Secondary | ICD-10-CM | POA: Diagnosis not present

## 2021-11-26 DIAGNOSIS — S46012D Strain of muscle(s) and tendon(s) of the rotator cuff of left shoulder, subsequent encounter: Secondary | ICD-10-CM | POA: Diagnosis not present

## 2021-11-26 DIAGNOSIS — M25512 Pain in left shoulder: Secondary | ICD-10-CM | POA: Diagnosis not present

## 2021-11-28 DIAGNOSIS — M25512 Pain in left shoulder: Secondary | ICD-10-CM | POA: Diagnosis not present

## 2021-11-28 DIAGNOSIS — M6281 Muscle weakness (generalized): Secondary | ICD-10-CM | POA: Diagnosis not present

## 2021-11-28 DIAGNOSIS — S46012D Strain of muscle(s) and tendon(s) of the rotator cuff of left shoulder, subsequent encounter: Secondary | ICD-10-CM | POA: Diagnosis not present

## 2021-12-10 DIAGNOSIS — M25512 Pain in left shoulder: Secondary | ICD-10-CM | POA: Diagnosis not present

## 2021-12-10 DIAGNOSIS — S46012D Strain of muscle(s) and tendon(s) of the rotator cuff of left shoulder, subsequent encounter: Secondary | ICD-10-CM | POA: Diagnosis not present

## 2021-12-10 DIAGNOSIS — M6281 Muscle weakness (generalized): Secondary | ICD-10-CM | POA: Diagnosis not present

## 2021-12-11 DIAGNOSIS — M25512 Pain in left shoulder: Secondary | ICD-10-CM | POA: Diagnosis not present

## 2021-12-26 DIAGNOSIS — R262 Difficulty in walking, not elsewhere classified: Secondary | ICD-10-CM | POA: Diagnosis not present

## 2021-12-26 DIAGNOSIS — M6281 Muscle weakness (generalized): Secondary | ICD-10-CM | POA: Diagnosis not present

## 2022-05-13 DIAGNOSIS — J40 Bronchitis, not specified as acute or chronic: Secondary | ICD-10-CM | POA: Diagnosis not present

## 2022-05-13 DIAGNOSIS — R051 Acute cough: Secondary | ICD-10-CM | POA: Diagnosis not present

## 2022-05-13 DIAGNOSIS — J069 Acute upper respiratory infection, unspecified: Secondary | ICD-10-CM | POA: Diagnosis not present

## 2022-05-13 DIAGNOSIS — D367 Benign neoplasm of other specified sites: Secondary | ICD-10-CM | POA: Diagnosis not present

## 2022-05-28 DIAGNOSIS — Z0000 Encounter for general adult medical examination without abnormal findings: Secondary | ICD-10-CM | POA: Diagnosis not present

## 2022-05-28 DIAGNOSIS — F319 Bipolar disorder, unspecified: Secondary | ICD-10-CM | POA: Diagnosis not present

## 2022-05-28 DIAGNOSIS — Z23 Encounter for immunization: Secondary | ICD-10-CM | POA: Diagnosis not present

## 2022-05-28 DIAGNOSIS — Z8619 Personal history of other infectious and parasitic diseases: Secondary | ICD-10-CM | POA: Diagnosis not present

## 2022-05-28 DIAGNOSIS — M79641 Pain in right hand: Secondary | ICD-10-CM | POA: Diagnosis not present

## 2022-06-25 DIAGNOSIS — L720 Epidermal cyst: Secondary | ICD-10-CM | POA: Diagnosis not present

## 2022-07-04 DIAGNOSIS — F331 Major depressive disorder, recurrent, moderate: Secondary | ICD-10-CM | POA: Diagnosis not present

## 2022-07-04 DIAGNOSIS — F419 Anxiety disorder, unspecified: Secondary | ICD-10-CM | POA: Diagnosis not present

## 2022-07-10 ENCOUNTER — Other Ambulatory Visit: Payer: Self-pay | Admitting: Surgery

## 2022-07-10 DIAGNOSIS — D1721 Benign lipomatous neoplasm of skin and subcutaneous tissue of right arm: Secondary | ICD-10-CM | POA: Diagnosis not present

## 2022-07-25 ENCOUNTER — Encounter: Payer: Self-pay | Admitting: Obstetrics and Gynecology

## 2022-07-25 ENCOUNTER — Other Ambulatory Visit (HOSPITAL_COMMUNITY)
Admission: RE | Admit: 2022-07-25 | Discharge: 2022-07-25 | Disposition: A | Discharge status: Home / Self Care | Payer: BC Managed Care – PPO | Source: Ambulatory Visit | Attending: Obstetrics and Gynecology | Admitting: Obstetrics and Gynecology

## 2022-07-25 ENCOUNTER — Ambulatory Visit (INDEPENDENT_AMBULATORY_CARE_PROVIDER_SITE_OTHER): Payer: BC Managed Care – PPO | Admitting: Obstetrics and Gynecology

## 2022-07-25 VITALS — BP 144/100 | HR 97 | Wt 115.0 lb

## 2022-07-25 DIAGNOSIS — Z13220 Encounter for screening for lipoid disorders: Secondary | ICD-10-CM | POA: Diagnosis not present

## 2022-07-25 DIAGNOSIS — N3946 Mixed incontinence: Secondary | ICD-10-CM | POA: Diagnosis not present

## 2022-07-25 DIAGNOSIS — Z01419 Encounter for gynecological examination (general) (routine) without abnormal findings: Secondary | ICD-10-CM | POA: Insufficient documentation

## 2022-07-25 DIAGNOSIS — F319 Bipolar disorder, unspecified: Secondary | ICD-10-CM | POA: Diagnosis not present

## 2022-07-25 DIAGNOSIS — F3289 Other specified depressive episodes: Secondary | ICD-10-CM | POA: Diagnosis not present

## 2022-07-25 DIAGNOSIS — Z113 Encounter for screening for infections with a predominantly sexual mode of transmission: Secondary | ICD-10-CM | POA: Diagnosis not present

## 2022-07-25 NOTE — Progress Notes (Signed)
Subjective:     Kathryn Norman is a 41 y.o. female P3 with BMI 20 who is here for a comprehensive physical exam. The patient reports many complaints, most of which are chronic. She reports stress incontinence and state medication prescribed is not helping. She reports knee and joint paint.She is sexually active using IUD for contraception and reports postcoital vaginal spotting which lasts for 24 hours. . She denies pelvic pain or abnormal discharge. She is without any other complaints  Past Medical History:  Diagnosis Date   ADHD (attention deficit hyperactivity disorder)    Anemia    Anxiety and depression 06/19/2012   Asthma    Chlamydia infection 10/18/2014   Treated   Depression    Hemorrhagic ovarian cyst 09/07/2012   Migraines    Past Surgical History:  Procedure Laterality Date   CHOLECYSTECTOMY     DILATION AND CURETTAGE OF UTERUS     x 2   Family History  Problem Relation Age of Onset   Migraines Mother    Alopecia Mother    Sickle cell trait Mother    Cancer Father        died of lung cancer   Sickle cell anemia Sister    Seizures Brother    Social History   Socioeconomic History   Marital status: Single    Spouse name: Not on file   Number of children: Not on file   Years of education: Not on file   Highest education level: Not on file  Occupational History   Not on file  Tobacco Use   Smoking status: Former    Packs/day: 0.10    Types: Cigarettes    Start date: 10/21/2011   Smokeless tobacco: Never  Vaping Use   Vaping Use: Never used  Substance and Sexual Activity   Alcohol use: No    Alcohol/week: 0.0 standard drinks of alcohol   Drug use: No   Sexual activity: Yes    Partners: Male    Birth control/protection: None  Other Topics Concern   Not on file  Social History Narrative   Not on file   Social Determinants of Health   Financial Resource Strain: Not on file  Food Insecurity: Not on file  Transportation Needs: Not on file   Physical Activity: Not on file  Stress: Not on file  Social Connections: Not on file  Intimate Partner Violence: Not on file   Health Maintenance  Topic Date Due   COVID-19 Vaccine (1) Never done   MAMMOGRAM  Never done   DTaP/Tdap/Td (1 - Tdap) Never done   INFLUENZA VACCINE  Never done   PAP SMEAR-Modifier  08/22/2023   Hepatitis C Screening  Completed   HIV Screening  Completed   HPV VACCINES  Aged Out       Review of Systems Pertinent items noted in HPI and remainder of comprehensive ROS otherwise negative.   Objective:  Blood pressure (!) 144/100, pulse 97, weight 115 lb (52.2 kg).   GENERAL: Well-developed, well-nourished female in no acute distress.  HEENT: Normocephalic, atraumatic. Sclerae anicteric.  NECK: Supple. Normal thyroid.  LUNGS: Clear to auscultation bilaterally.  HEART: Regular rate and rhythm. BREASTS: Symmetric in size. No palpable masses or lymphadenopathy, skin changes, or nipple drainage. ABDOMEN: Soft, nontender, nondistended. No organomegaly. PELVIC: Normal external female genitalia. Vagina is pink and rugated.  Normal discharge. Normal appearing cervix with IUD strings curled into a ball at the cervical os. Uterus is normal in size. No adnexal  mass or tenderness. Chaperone present during the pelvic exam EXTREMITIES: No cyanosis, clubbing, or edema, 2+ distal pulses.     Assessment:    Healthy female exam.      Plan:    Pap smear collected STI screening and health maintenance labs ordered Screening mammogram ordered Patient will be contacted with abnormal results Patient referred to urogynecologist for management of stress incontinence See After Visit Summary for Counseling Recommendations

## 2022-07-25 NOTE — Progress Notes (Signed)
Pt states she has IUD in place, will have pain/ bleeding with intercourse.  Pt states she occ spotting, no full cycle, with IUD. LMP unknown.

## 2022-07-26 LAB — COMPREHENSIVE METABOLIC PANEL
ALT: 15 IU/L (ref 0–32)
AST: 20 IU/L (ref 0–40)
Albumin/Globulin Ratio: 2.4 — ABNORMAL HIGH (ref 1.2–2.2)
Albumin: 5.2 g/dL — ABNORMAL HIGH (ref 3.9–4.9)
Alkaline Phosphatase: 84 IU/L (ref 44–121)
BUN/Creatinine Ratio: 13 (ref 9–23)
BUN: 12 mg/dL (ref 6–24)
Bilirubin Total: 0.6 mg/dL (ref 0.0–1.2)
CO2: 23 mmol/L (ref 20–29)
Calcium: 9.8 mg/dL (ref 8.7–10.2)
Chloride: 103 mmol/L (ref 96–106)
Creatinine, Ser: 0.92 mg/dL (ref 0.57–1.00)
Globulin, Total: 2.2 g/dL (ref 1.5–4.5)
Glucose: 79 mg/dL (ref 70–99)
Potassium: 3.7 mmol/L (ref 3.5–5.2)
Sodium: 140 mmol/L (ref 134–144)
Total Protein: 7.4 g/dL (ref 6.0–8.5)
eGFR: 80 mL/min/{1.73_m2} (ref >59)

## 2022-07-26 LAB — LIPID PANEL
Chol/HDL Ratio: 2.9 ratio (ref 0.0–4.4)
Cholesterol, Total: 206 mg/dL — ABNORMAL HIGH (ref 100–199)
HDL: 71 mg/dL (ref >39)
LDL Chol Calc (NIH): 128 mg/dL — ABNORMAL HIGH (ref 0–99)
Triglycerides: 39 mg/dL (ref 0–149)
VLDL Cholesterol Cal: 7 mg/dL (ref 5–40)

## 2022-07-26 LAB — CERVICOVAGINAL ANCILLARY ONLY
Bacterial Vaginitis (gardnerella): POSITIVE — AB
Candida Glabrata: NEGATIVE
Candida Vaginitis: NEGATIVE
Chlamydia: NEGATIVE
Comment: NEGATIVE
Comment: NEGATIVE
Comment: NEGATIVE
Comment: NEGATIVE
Comment: NEGATIVE
Comment: NORMAL
Neisseria Gonorrhea: NEGATIVE
Trichomonas: NEGATIVE

## 2022-07-26 LAB — TSH: TSH: 1 u[IU]/mL (ref 0.450–4.500)

## 2022-07-26 LAB — CBC
Hematocrit: 40.9 % (ref 34.0–46.6)
Hemoglobin: 13.8 g/dL (ref 11.1–15.9)
MCH: 30.7 pg (ref 26.6–33.0)
MCHC: 33.7 g/dL (ref 31.5–35.7)
MCV: 91 fL (ref 79–97)
Platelets: 240 10*3/uL (ref 150–450)
RBC: 4.5 x10E6/uL (ref 3.77–5.28)
RDW: 13.1 % (ref 11.7–15.4)
WBC: 5.8 10*3/uL (ref 3.4–10.8)

## 2022-07-26 LAB — HEMOGLOBIN A1C
Est. average glucose Bld gHb Est-mCnc: 111 mg/dL
Hgb A1c MFr Bld: 5.5 % (ref 4.8–5.6)

## 2022-07-26 LAB — HEPATITIS B SURFACE ANTIGEN: Hepatitis B Surface Ag: NEGATIVE

## 2022-07-26 LAB — HIV ANTIBODY (ROUTINE TESTING W REFLEX): HIV Screen 4th Generation wRfx: NONREACTIVE

## 2022-07-26 LAB — HEPATITIS C ANTIBODY: Hep C Virus Ab: NONREACTIVE

## 2022-07-26 LAB — RPR: RPR Ser Ql: NONREACTIVE

## 2022-07-29 LAB — CYTOLOGY - PAP
Comment: NEGATIVE
Diagnosis: NEGATIVE
High risk HPV: NEGATIVE

## 2022-07-30 MED ORDER — METRONIDAZOLE 500 MG PO TABS: 500.0000 mg | ORAL_TABLET | Freq: Two times a day (BID) | ORAL | 0 refills | Status: DC

## 2022-07-30 NOTE — Addendum Note (Signed)
Addended by: Catalina Antigua on: 07/30/2022 01:53 PM   Modules accepted: Orders

## 2022-08-05 ENCOUNTER — Ambulatory Visit (HOSPITAL_BASED_OUTPATIENT_CLINIC_OR_DEPARTMENT_OTHER): Admission: RE | Admit: 2022-08-05 | Payer: BC Managed Care – PPO | Source: Ambulatory Visit | Admitting: Radiology

## 2022-09-02 ENCOUNTER — Ambulatory Visit (HOSPITAL_BASED_OUTPATIENT_CLINIC_OR_DEPARTMENT_OTHER): Admission: RE | Admit: 2022-09-02 | Payer: BC Managed Care – PPO | Source: Ambulatory Visit | Admitting: Radiology

## 2022-10-30 DIAGNOSIS — M79644 Pain in right finger(s): Secondary | ICD-10-CM | POA: Diagnosis not present

## 2022-11-04 ENCOUNTER — Ambulatory Visit: Payer: BC Managed Care – PPO | Admitting: Obstetrics and Gynecology

## 2022-11-11 ENCOUNTER — Ambulatory Visit: Payer: BC Managed Care – PPO | Admitting: Obstetrics and Gynecology

## 2022-11-20 ENCOUNTER — Ambulatory Visit: Payer: BC Managed Care – PPO | Admitting: Obstetrics and Gynecology

## 2022-12-05 DIAGNOSIS — R3589 Other polyuria: Secondary | ICD-10-CM | POA: Diagnosis not present

## 2022-12-05 DIAGNOSIS — M79641 Pain in right hand: Secondary | ICD-10-CM | POA: Diagnosis not present

## 2022-12-05 DIAGNOSIS — R12 Heartburn: Secondary | ICD-10-CM | POA: Diagnosis not present

## 2022-12-05 DIAGNOSIS — F319 Bipolar disorder, unspecified: Secondary | ICD-10-CM | POA: Diagnosis not present

## 2022-12-05 DIAGNOSIS — J45901 Unspecified asthma with (acute) exacerbation: Secondary | ICD-10-CM | POA: Diagnosis not present

## 2023-01-03 DIAGNOSIS — M25511 Pain in right shoulder: Secondary | ICD-10-CM | POA: Diagnosis not present

## 2023-01-03 DIAGNOSIS — M25572 Pain in left ankle and joints of left foot: Secondary | ICD-10-CM | POA: Diagnosis not present

## 2023-01-03 DIAGNOSIS — M25512 Pain in left shoulder: Secondary | ICD-10-CM | POA: Diagnosis not present

## 2023-01-15 DIAGNOSIS — M25512 Pain in left shoulder: Secondary | ICD-10-CM | POA: Diagnosis not present

## 2023-01-24 DIAGNOSIS — M25512 Pain in left shoulder: Secondary | ICD-10-CM | POA: Diagnosis not present

## 2023-02-03 ENCOUNTER — Encounter: Payer: Self-pay | Admitting: Obstetrics and Gynecology

## 2023-02-03 ENCOUNTER — Ambulatory Visit (INDEPENDENT_AMBULATORY_CARE_PROVIDER_SITE_OTHER): Payer: BC Managed Care – PPO | Admitting: Obstetrics and Gynecology

## 2023-02-03 VITALS — BP 136/87 | HR 66 | Ht 62.0 in | Wt 119.2 lb

## 2023-02-03 DIAGNOSIS — N8110 Cystocele, unspecified: Secondary | ICD-10-CM

## 2023-02-03 DIAGNOSIS — N393 Stress incontinence (female) (male): Secondary | ICD-10-CM | POA: Diagnosis not present

## 2023-02-03 DIAGNOSIS — R351 Nocturia: Secondary | ICD-10-CM | POA: Diagnosis not present

## 2023-02-03 DIAGNOSIS — N3281 Overactive bladder: Secondary | ICD-10-CM | POA: Diagnosis not present

## 2023-02-03 DIAGNOSIS — R350 Frequency of micturition: Secondary | ICD-10-CM

## 2023-02-03 LAB — POCT URINALYSIS DIPSTICK
Bilirubin, UA: NEGATIVE
Blood, UA: NEGATIVE
Glucose, UA: NEGATIVE
Ketones, UA: NEGATIVE
Leukocytes, UA: NEGATIVE
Nitrite, UA: NEGATIVE
Protein, UA: NEGATIVE
Spec Grav, UA: 1.02 (ref 1.010–1.025)
Urobilinogen, UA: 0.2 E.U./dL
pH, UA: 7.5 (ref 5.0–8.0)

## 2023-02-03 MED ORDER — MIRABEGRON ER 50 MG PO TB24: 50.0000 mg | ORAL_TABLET | Freq: Every day | ORAL | 5 refills | Status: DC

## 2023-02-03 NOTE — Progress Notes (Signed)
[facility] New Patient Evaluation and Consultation  Referring Provider: Charlane Ferretti, DO PCP: Charlane Ferretti, DO Date of Service: 02/03/2023  SUBJECTIVE Chief Complaint: New Patient (Initial Visit) Kathryn Norman is a 42 y.o. female is here for)  History of Present Illness: Kathryn Norman is a 42 y.o. Black or African-American female seen in consultation at the request of Dr. Thornell Mule for evaluation of mixed incontinence.    Review of records significant for: Is taking Detrol 4mg  and is having constipation and is not well controlled on it.   Urinary Symptoms: Leaks urine with with a full bladder and with urgency Leaks 1-5 time(s) per weeks.  Pad use: 2 pads per day.   She is bothered by her UI symptoms.  Day time voids 20.  Nocturia: 6 times per night to void. Voiding dysfunction: she does not empty her bladder well.  does not use a catheter to empty bladder.  When urinating, she feels dribbling after finishing and the need to urinate multiple times in a row Drinks: 8 cups Water per day  UTIs: 0 UTI's in the last year.   Denies history of blood in urine, kidney or bladder stones, pyelonephritis, bladder cancer, and kidney cancer  Pelvic Organ Prolapse Symptoms:                  She Denies a feeling of a bulge the vaginal area.   Bowel Symptom: Bowel movements: 2 time(s) per week Stool consistency: soft  Straining: no.  Splinting: no.  Incomplete evacuation: no.  She Denies accidental bowel leakage / fecal incontinence Bowel regimen: none Last colonoscopy: Never Sexual Function Sexually active: no.  Sexual orientation: Straight Pain with sex: No  Pelvic Pain Denies pelvic pain    Past Medical History:  Past Medical History:  Diagnosis Date   ADHD (attention deficit hyperactivity disorder)    Anemia    Anxiety and depression 06/19/2012   Asthma    Chlamydia infection 10/18/2014   Treated   Depression    Hemorrhagic ovarian cyst  09/07/2012   Migraines      Past Surgical History:   Past Surgical History:  Procedure Laterality Date   CHOLECYSTECTOMY     DILATION AND CURETTAGE OF UTERUS     x 2     Past OB/GYN History: G6 P3 Vaginal deliveries: 3,  Forceps/ Vacuum deliveries: 1 First pregnancy, Cesarean section: 0 Menopausal: No, abnormal periods with IUD Contraception: IUD. Last pap smear was 07/25/22.  Any history of abnormal pap smears: yes.   Medications: She has a current medication list which includes the following prescription(s): albuterol, albuterol, aripiprazole, atomoxetine, qvar redihaler, diclofenac, famotidine, gabapentin, ibuprofen, meloxicam, mirabegron er, mirtazapine, omeprazole, sumatriptan, vitamin d (ergocalciferol), wixela inhub, montelukast, and [DISCONTINUED] cetirizine.   Allergies: Patient is allergic to latex, methocarbamol, acetaminophen, and red dye.   Social History:  Social History   Tobacco Use   Smoking status: Former    Packs/day: .1    Types: Cigarettes    Start date: 10/21/2011   Smokeless tobacco: Never  Vaping Use   Vaping Use: Every day  Substance Use Topics   Alcohol use: No    Alcohol/week: 0.0 standard drinks of alcohol   Drug use: No    Relationship status: single She lives with children.   She is employed with [employer]. Regular exercise: No History of abuse: No  Family History:   Family History  Problem Relation Age of Onset   Migraines Mother    Alopecia  Mother    Sickle cell trait Mother    Cancer Father        died of lung cancer   Sickle cell anemia Sister    Seizures Brother      Review of Systems: Review of Systems  Constitutional:  Positive for weight loss. Negative for fever and malaise/fatigue.  Respiratory:  Positive for cough, shortness of breath and wheezing.   Cardiovascular:  Positive for chest pain and palpitations. Negative for leg swelling.  Gastrointestinal:  Negative for abdominal pain and blood in stool.  Genitourinary:   Negative for dysuria.       +Abnormal periods  Skin:  Negative for rash.  Neurological:  Positive for dizziness, weakness and headaches.  Endo/Heme/Allergies:  Bruises/bleeds easily.     OBJECTIVE Physical Exam: Vitals:   02/03/23 1511  BP: 136/87  Pulse: 66  Weight: 119 lb 3.2 oz (54.1 kg)  Height: 5\' 2"  (1.575 m)    Physical Exam Constitutional:      General: She is not in acute distress.    Appearance: Normal appearance. She is normal weight. She is not ill-appearing.  Skin:    General: Skin is warm and dry.  Neurological:     General: No focal deficit present.     Mental Status: She is alert and oriented to person, place, and time.  Psychiatric:        Mood and Affect: Mood normal.        Behavior: Behavior normal.        Thought Content: Thought content normal.        Judgment: Judgment normal.      GU / Detailed Urogynecologic Evaluation:  Pelvic Exam: Normal external female genitalia; Bartholin's and Skene's glands normal in appearance; urethral meatus normal in appearance, no urethral masses or discharge.   CST: negative   Speculum exam reveals normal vaginal mucosa without atrophy. Cervix normal appearance and IUD string visualized. Uterus normal single, nontender. Adnexa normal adnexa.    With apex supported, anterior compartment defect was present  Pelvic floor strength IV/V  Pelvic floor musculature: Right levator tender, Right obturator tender, Left levator non-tender, Left obturator tender  POP-Q:   POP-Q  -1.5                                            Aa   -1.5                                           Ba  -5                                              C   2.5                                            Gh  3  Pb  7.5                                            tvl   -2.5                                            Ap  -2.5                                            Bp  -5.5                                               D      Rectal Exam:  Normal external exam  Post-Void Residual (PVR) by Bladder Scan: In order to evaluate bladder emptying, we discussed obtaining a postvoid residual and she agreed to this procedure.  Procedure: The ultrasound unit was placed on the patient's abdomen in the suprapubic region after the patient had voided. A PVR of 0 ml was obtained by bladder scan.  Laboratory Results: POC Urine: Negative   ASSESSMENT AND PLAN Ms. Masri is a 42 y.o. with:  1. Urinary frequency   2. Overactive bladder   3. SUI (stress urinary incontinence, female)   4. Nocturia   5. Prolapse of anterior vaginal wall    Patient has a significant amount of frequency and and urgency. Plan to discontinue the Detrol 4mg  and start patient on Myrbetriq 50mg  daily. As she has significant constipation and is only able to have a bowel movement x2 weekly.  Would not suggest another anticholinergic as patient endorses constipation, dry mouth, and brain fog with her current Detrol regimen. Beta 3 agonists may be better in calming down patient's bladder with less symptoms. If this is not helpful discussed tertiary therapies briefly including bladder botox, SNM, and PTNS. She said her mother has SNM.  Patient has significant leakage without urgency with walking and standing. We discussed that this can be addressed with surgical procedure or urethral bulking. Will plan for patient to return for simple CMG.  Will start Myrbetriq.  Patient has stage I/IV anterior wall prolapse. She is asymptomatic. Will expectantly manage.   Patient to follow up in 6 weeks for medication check and for simple CMG.    Selmer Dominion, NP

## 2023-02-03 NOTE — Patient Instructions (Addendum)
Stop the Detrol and start Myrbetriq 50mg  daily.   Consider the options of the Sacral neuromodulation and urethral bulking.

## 2023-02-04 DIAGNOSIS — M25512 Pain in left shoulder: Secondary | ICD-10-CM | POA: Diagnosis not present

## 2023-02-05 NOTE — Progress Notes (Signed)
Contacted [employer] to obtain prior auth for patients Myrbetriq 50mg . Pending approval Review # [id number] ID# [id number]

## 2023-02-06 DIAGNOSIS — M79641 Pain in right hand: Secondary | ICD-10-CM | POA: Diagnosis not present

## 2023-03-05 DIAGNOSIS — M545 Low back pain, unspecified: Secondary | ICD-10-CM | POA: Diagnosis not present

## 2023-03-17 ENCOUNTER — Ambulatory Visit: Payer: BC Managed Care – PPO | Admitting: Obstetrics and Gynecology

## 2023-03-25 ENCOUNTER — Ambulatory Visit: Payer: BC Managed Care – PPO | Admitting: Obstetrics and Gynecology

## 2023-03-26 ENCOUNTER — Encounter: Payer: Self-pay | Admitting: Obstetrics and Gynecology

## 2023-03-26 ENCOUNTER — Ambulatory Visit (INDEPENDENT_AMBULATORY_CARE_PROVIDER_SITE_OTHER): Payer: BC Managed Care – PPO | Admitting: Obstetrics and Gynecology

## 2023-03-26 VITALS — BP 118/68 | HR 66

## 2023-03-26 DIAGNOSIS — N393 Stress incontinence (female) (male): Secondary | ICD-10-CM

## 2023-03-26 DIAGNOSIS — R351 Nocturia: Secondary | ICD-10-CM

## 2023-03-26 DIAGNOSIS — N3281 Overactive bladder: Secondary | ICD-10-CM

## 2023-03-26 DIAGNOSIS — M4146 Neuromuscular scoliosis, lumbar region: Secondary | ICD-10-CM | POA: Diagnosis not present

## 2023-03-26 DIAGNOSIS — M5451 Vertebrogenic low back pain: Secondary | ICD-10-CM | POA: Diagnosis not present

## 2023-03-26 DIAGNOSIS — R350 Frequency of micturition: Secondary | ICD-10-CM | POA: Diagnosis not present

## 2023-03-26 DIAGNOSIS — M5416 Radiculopathy, lumbar region: Secondary | ICD-10-CM | POA: Diagnosis not present

## 2023-03-26 MED ORDER — TROSPIUM CHLORIDE ER 60 MG PO CP24: 60.0000 mg | ORAL_CAPSULE | Freq: Every day | ORAL | 5 refills | Status: DC

## 2023-03-26 NOTE — Patient Instructions (Addendum)
Stop the Myrbetriq and Start Trospium 60mg  ER daily.   We will plan for you to return for self-cath teaching and then schedule for bladder botox. You did not leak on exam today with coughing and bearing down, but you did have significant bladder spasms.

## 2023-03-26 NOTE — Progress Notes (Signed)
[facility] Return Visit  SUBJECTIVE  History of Present Illness: MARAJADE Norman is a 42 y.o. female seen in follow-up for OAB. Plan at last visit was start.   Patient reports that her samples seemed to work well but then about two weeks after starting her medication, it seems to not be working. She has been getting up every 15 minutes at night.   She reports she has considered between her options of PTNS, Bladder botox, and SNM.   Past Medical History: Patient  has a past medical history of ADHD (attention deficit hyperactivity disorder), Anemia, Anxiety and depression (06/19/2012), Asthma, Chlamydia infection (10/18/2014), Depression, Hemorrhagic ovarian cyst (09/07/2012), and Migraines.   Past Surgical History: She  has a past surgical history that includes Dilation and curettage of uterus and Cholecystectomy.   Medications: She has a current medication list which includes the following prescription(s): albuterol, albuterol, aripiprazole, atomoxetine, qvar redihaler, diclofenac, famotidine, gabapentin, ibuprofen, meloxicam, mirtazapine, omeprazole, sumatriptan, trospium chloride, vitamin d (ergocalciferol), wixela inhub, montelukast, and [DISCONTINUED] cetirizine.   Allergies: Patient is allergic to latex, methocarbamol, acetaminophen, and red dye.   Social History: Patient  reports that she has quit smoking. Her smoking use included cigarettes. She started smoking about 11 years ago. She has a 1.1 pack-year smoking history. She has never used smokeless tobacco. She reports that she does not drink alcohol and does not use drugs.      OBJECTIVE    Verbal consent was obtained to perform simple CMG procedure:   Prolapse was reduced using 2 large cotton swabs. Urethra was prepped with betadine and a 20F catheter was placed and bladder was drained completely. The bladder was then backfilled with sterile water by gravity.  First sensation: 50 First Desire: 60 Strong  Desire: 120 Capacity: 190 Cough stress test was negative. Valsalva stress test was negative.  She was was allowed to void on her own.   Interpretation: CMG showed increased sensation, and decreased cystometric capacity. Findings negative for stress incontinence, positive for detrusor overactivity.      Physical Exam: Vitals:   03/26/23 1316  BP: 118/68  Pulse: 66   Gen: No apparent distress, A&O x 3.  Detailed Urogynecologic Evaluation:  Deferred.    ASSESSMENT AND PLAN    Kathryn Norman is a 42 y.o. with:  1. Nocturia   2. SUI (stress urinary incontinence, female)   3. Overactive bladder   4. Urinary frequency    Patient reports she is up >6 times per night to urinate. She would like to go forward with bladder botox and if that is not helpful she wants to consider SNM. We will have her scheduled to come back for self-cath teaching and then scheduled for bladder botox.  Patient did not demonstrate leakage for SUI with simple CMG today. She reports she has been doing pelvic floor PT.  Patient has tried Detrol, Oxybutynin, and Myrbetriq. Will start her on Trospium 60mg  ER daily while she waits to do botox.  Patient has significant Detrusor overactivity on exam today with simple CMG.  Patient to follow up for self cath teaching and subsequent bladder botox.

## 2023-03-31 ENCOUNTER — Ambulatory Visit: Payer: BC Managed Care – PPO

## 2023-04-08 DIAGNOSIS — M4146 Neuromuscular scoliosis, lumbar region: Secondary | ICD-10-CM | POA: Diagnosis not present

## 2023-04-08 DIAGNOSIS — M5451 Vertebrogenic low back pain: Secondary | ICD-10-CM | POA: Diagnosis not present

## 2023-04-08 DIAGNOSIS — M5416 Radiculopathy, lumbar region: Secondary | ICD-10-CM | POA: Diagnosis not present

## 2023-04-09 DIAGNOSIS — M5416 Radiculopathy, lumbar region: Secondary | ICD-10-CM | POA: Diagnosis not present

## 2023-04-16 ENCOUNTER — Ambulatory Visit (HOSPITAL_COMMUNITY)
Admission: EM | Admit: 2023-04-16 | Discharge: 2023-04-16 | Disposition: A | Discharge status: Home / Self Care | Payer: BC Managed Care – PPO | Attending: Nurse Practitioner | Admitting: Nurse Practitioner

## 2023-04-16 ENCOUNTER — Encounter (HOSPITAL_COMMUNITY): Payer: Self-pay

## 2023-04-16 DIAGNOSIS — M4146 Neuromuscular scoliosis, lumbar region: Secondary | ICD-10-CM | POA: Diagnosis not present

## 2023-04-16 DIAGNOSIS — M5416 Radiculopathy, lumbar region: Secondary | ICD-10-CM | POA: Diagnosis not present

## 2023-04-16 DIAGNOSIS — R519 Headache, unspecified: Secondary | ICD-10-CM | POA: Diagnosis not present

## 2023-04-16 DIAGNOSIS — K047 Periapical abscess without sinus: Secondary | ICD-10-CM | POA: Diagnosis not present

## 2023-04-16 DIAGNOSIS — H9201 Otalgia, right ear: Secondary | ICD-10-CM

## 2023-04-16 DIAGNOSIS — M5451 Vertebrogenic low back pain: Secondary | ICD-10-CM | POA: Diagnosis not present

## 2023-04-16 MED ORDER — IBUPROFEN 800 MG PO TABS: 800.0000 mg | ORAL_TABLET | Freq: Three times a day (TID) | ORAL | 0 refills | Status: AC | PRN

## 2023-04-16 MED ORDER — ONDANSETRON 4 MG PO TBDP: 4.0000 mg | ORAL_TABLET | Freq: Three times a day (TID) | ORAL | 0 refills | Status: DC | PRN

## 2023-04-16 MED ORDER — TRAMADOL HCL 50 MG PO TABS: 50.0000 mg | ORAL_TABLET | Freq: Two times a day (BID) | ORAL | 0 refills | Status: AC | PRN

## 2023-04-16 MED ORDER — AMOXICILLIN-POT CLAVULANATE 875-125 MG PO TABS: 1.0000 | ORAL_TABLET | Freq: Two times a day (BID) | ORAL | 0 refills | Status: AC
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
## 2023-04-16 NOTE — Discharge Instructions (Signed)
Continue ibuprofen 800 mg every 8 hours as needed to help with pain.  Continue cool compresses to help with swelling.  Take Augmentin to treat the dental abscess.  Follow-up with a dentist-contact information has been provided.  If pain persists despite the ibuprofen, you can take the tramadol every 12 hours as needed, remember to separate this from other sedating medications.
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
## 2023-04-21 ENCOUNTER — Institutional Professional Consult (permissible substitution): Payer: BC Managed Care – PPO | Admitting: Pulmonary Disease

## 2023-04-25 ENCOUNTER — Encounter: Payer: Self-pay | Admitting: Pulmonary Disease

## 2023-04-30 DIAGNOSIS — M5451 Vertebrogenic low back pain: Secondary | ICD-10-CM | POA: Diagnosis not present

## 2023-04-30 DIAGNOSIS — M4146 Neuromuscular scoliosis, lumbar region: Secondary | ICD-10-CM | POA: Diagnosis not present

## 2023-04-30 DIAGNOSIS — M5416 Radiculopathy, lumbar region: Secondary | ICD-10-CM | POA: Diagnosis not present

## 2023-05-01 ENCOUNTER — Ambulatory Visit: Payer: BC Managed Care – PPO

## 2023-05-01 ENCOUNTER — Ambulatory Visit: Payer: BC Managed Care – PPO | Admitting: Obstetrics and Gynecology

## 2023-05-06 DIAGNOSIS — M5416 Radiculopathy, lumbar region: Secondary | ICD-10-CM | POA: Diagnosis not present

## 2023-05-06 DIAGNOSIS — M4146 Neuromuscular scoliosis, lumbar region: Secondary | ICD-10-CM | POA: Diagnosis not present

## 2023-05-06 DIAGNOSIS — M5451 Vertebrogenic low back pain: Secondary | ICD-10-CM | POA: Diagnosis not present

## 2023-05-27 DIAGNOSIS — M5416 Radiculopathy, lumbar region: Secondary | ICD-10-CM | POA: Diagnosis not present

## 2023-05-27 DIAGNOSIS — M4146 Neuromuscular scoliosis, lumbar region: Secondary | ICD-10-CM | POA: Diagnosis not present

## 2023-05-27 DIAGNOSIS — M5451 Vertebrogenic low back pain: Secondary | ICD-10-CM | POA: Diagnosis not present

## 2023-06-17 DIAGNOSIS — M5451 Vertebrogenic low back pain: Secondary | ICD-10-CM | POA: Diagnosis not present

## 2023-06-17 DIAGNOSIS — M4146 Neuromuscular scoliosis, lumbar region: Secondary | ICD-10-CM | POA: Diagnosis not present

## 2023-06-17 DIAGNOSIS — M5416 Radiculopathy, lumbar region: Secondary | ICD-10-CM | POA: Diagnosis not present

## 2023-06-20 DIAGNOSIS — M4146 Neuromuscular scoliosis, lumbar region: Secondary | ICD-10-CM | POA: Diagnosis not present

## 2023-06-24 DIAGNOSIS — M7521 Bicipital tendinitis, right shoulder: Secondary | ICD-10-CM | POA: Diagnosis not present

## 2023-07-09 DIAGNOSIS — M4146 Neuromuscular scoliosis, lumbar region: Secondary | ICD-10-CM | POA: Diagnosis not present

## 2023-07-09 DIAGNOSIS — M5451 Vertebrogenic low back pain: Secondary | ICD-10-CM | POA: Diagnosis not present

## 2023-07-09 DIAGNOSIS — M5416 Radiculopathy, lumbar region: Secondary | ICD-10-CM | POA: Diagnosis not present

## 2023-07-12 DIAGNOSIS — M545 Low back pain, unspecified: Secondary | ICD-10-CM | POA: Diagnosis not present

## 2023-07-14 DIAGNOSIS — M5451 Vertebrogenic low back pain: Secondary | ICD-10-CM | POA: Diagnosis not present

## 2023-07-14 DIAGNOSIS — M5416 Radiculopathy, lumbar region: Secondary | ICD-10-CM | POA: Diagnosis not present

## 2023-07-14 DIAGNOSIS — M4146 Neuromuscular scoliosis, lumbar region: Secondary | ICD-10-CM | POA: Diagnosis not present

## 2023-09-17 DIAGNOSIS — M79632 Pain in left forearm: Secondary | ICD-10-CM | POA: Diagnosis not present

## 2023-09-17 DIAGNOSIS — M25532 Pain in left wrist: Secondary | ICD-10-CM | POA: Diagnosis not present

## 2023-09-25 DIAGNOSIS — L089 Local infection of the skin and subcutaneous tissue, unspecified: Secondary | ICD-10-CM | POA: Diagnosis not present

## 2023-09-25 DIAGNOSIS — R030 Elevated blood-pressure reading, without diagnosis of hypertension: Secondary | ICD-10-CM | POA: Diagnosis not present

## 2023-10-02 DIAGNOSIS — R051 Acute cough: Secondary | ICD-10-CM | POA: Diagnosis not present

## 2023-10-02 DIAGNOSIS — J45901 Unspecified asthma with (acute) exacerbation: Secondary | ICD-10-CM | POA: Diagnosis not present

## 2023-12-19 DIAGNOSIS — Z0000 Encounter for general adult medical examination without abnormal findings: Secondary | ICD-10-CM | POA: Diagnosis not present

## 2023-12-19 DIAGNOSIS — E559 Vitamin D deficiency, unspecified: Secondary | ICD-10-CM | POA: Diagnosis not present

## 2023-12-20 ENCOUNTER — Other Ambulatory Visit: Payer: Self-pay | Admitting: Obstetrics and Gynecology

## 2023-12-25 DIAGNOSIS — J45901 Unspecified asthma with (acute) exacerbation: Secondary | ICD-10-CM | POA: Diagnosis not present

## 2023-12-25 DIAGNOSIS — Z0000 Encounter for general adult medical examination without abnormal findings: Secondary | ICD-10-CM | POA: Diagnosis not present

## 2023-12-25 DIAGNOSIS — Z1331 Encounter for screening for depression: Secondary | ICD-10-CM | POA: Diagnosis not present

## 2023-12-25 DIAGNOSIS — Z1339 Encounter for screening examination for other mental health and behavioral disorders: Secondary | ICD-10-CM | POA: Diagnosis not present

## 2024-02-20 DIAGNOSIS — M545 Low back pain, unspecified: Secondary | ICD-10-CM | POA: Diagnosis not present

## 2024-03-03 ENCOUNTER — Ambulatory Visit (INDEPENDENT_AMBULATORY_CARE_PROVIDER_SITE_OTHER): Admitting: Obstetrics and Gynecology

## 2024-03-03 ENCOUNTER — Encounter: Payer: Self-pay | Admitting: Obstetrics and Gynecology

## 2024-03-03 VITALS — BP 117/92 | HR 68 | Ht 61.42 in | Wt 108.4 lb

## 2024-03-03 DIAGNOSIS — R350 Frequency of micturition: Secondary | ICD-10-CM | POA: Diagnosis not present

## 2024-03-03 DIAGNOSIS — R351 Nocturia: Secondary | ICD-10-CM | POA: Diagnosis not present

## 2024-03-03 DIAGNOSIS — N3281 Overactive bladder: Secondary | ICD-10-CM

## 2024-03-03 MED ORDER — VIBEGRON 75 MG PO TABS: 75.0000 mg | ORAL_TABLET | Freq: Every day | ORAL | Status: DC

## 2024-03-03 NOTE — Progress Notes (Signed)
 [facility] Return Visit  SUBJECTIVE  History of Present Illness: Kathryn Norman is a 43 y.o. female seen in follow-up for OAB. Plan at last visit was consider options and plan to follow up with MD to discuss Botox vs SNM.  Patient reports she has been taking Myrbetriq  50mg  and Trospium  60mg  ER daily together and this is still not controlling her OAB symptoms. Has previously failed Detrol  and Oxybutynin.   She also reports she is waking up in puddle of urine. She is having issues with accidents at work and is not getting bathroom breaks at work often.     Past Medical History: Patient  has a past medical history of ADHD (attention deficit hyperactivity disorder), Anemia, Anxiety and depression (06/19/2012), Asthma, Chlamydia infection (10/18/2014), Depression, Hemorrhagic ovarian cyst (09/07/2012), and Migraines.   Past Surgical History: She  has a past surgical history that includes Dilation and curettage of uterus and Cholecystectomy.   Medications: She has a current medication list which includes the following prescription(s): albuterol , albuterol , aripiprazole, atomoxetine, qvar  redihaler, diclofenac , famotidine, gabapentin, ibuprofen , meloxicam, methocarbamol , mirabegron  er, mirtazapine, montelukast , omeprazole, ondansetron , sumatriptan , trospium  chloride, vitamin d (ergocalciferol), wixela inhub, and [DISCONTINUED] cetirizine .   Allergies: Patient is allergic to latex, methocarbamol , acetaminophen , and red dye #40 (allura red).   Social History: Patient  reports that she has quit smoking. Her smoking use included cigarettes. She started smoking about 12 years ago. She has a 1.2 pack-year smoking history. She has never used smokeless tobacco. She reports that she does not drink alcohol and does not use drugs.     OBJECTIVE     Physical Exam: Vitals:   03/03/24 1404  BP: (!) 117/92  Pulse: 68  Weight: 108 lb 6.4 oz (49.2 kg)  Height: 5' 1.42 (1.56 m)    Gen: No apparent distress, A&O x 3.  Detailed Urogynecologic Evaluation:  Deferred.    ASSESSMENT AND PLAN    Kathryn Norman is a 43 y.o. with:  1. Nocturia   2. Overactive bladder   3. Urinary frequency    1. Patient can try Vibegron  75mg  daily 2. She will consider Bladder botox vs. SNM. Has watched video on Medtronic SNM and is considering this.  3. Patient to follow up with MD to discuss tertiary therapies. We attempted to do a simple CMG and her bladder spasms were so significant that at 50ml instilled in the bladder she was having leakage around the catheter and the top of the syringe. Attempted a second time with same results. I encouraged patient that we will work on her OAB symptoms first and can focus on SUI symptoms if they are more bothersome.   Patient to return for consult with MD.   Keyshaun Exley G Shanell Aden, NP

## 2024-03-04 ENCOUNTER — Encounter: Payer: Self-pay | Admitting: Obstetrics

## 2024-03-04 ENCOUNTER — Ambulatory Visit (INDEPENDENT_AMBULATORY_CARE_PROVIDER_SITE_OTHER): Admitting: Obstetrics

## 2024-03-04 VITALS — BP 127/92 | HR 72

## 2024-03-04 DIAGNOSIS — N393 Stress incontinence (female) (male): Secondary | ICD-10-CM

## 2024-03-04 DIAGNOSIS — N3281 Overactive bladder: Secondary | ICD-10-CM

## 2024-03-04 DIAGNOSIS — R351 Nocturia: Secondary | ICD-10-CM

## 2024-03-04 NOTE — Assessment & Plan Note (Addendum)
-   02/03/23 POCT UA negative, PVR 0mL - Failed oxybutynin and detrol  (constipation), Myrbetriq  50mg  and Trospium  60mg  ER (no relief), PT without relief - some relief with Gemtesa , cost prohibitive - reports significant impact on QoL due to UUI, mother underwent SNM - For refractory OAB we reviewed the procedure for intravesical Botox injection with cystoscopy in the office and reviewed the risks, benefits and alternatives of treatment including but not limited to infection, need for self-catheterization and need for repeat therapy.  We discussed that there is a 5-15% chance of needing to catheterize with Botox and that this usually resolves in a few months; however can persist for longer periods of time.  Typically Botox injections would need to be repeated every 3-12 months since this is not a permanent therapy.   We discussed the role of sacral neuromodulation and how it works. It requires a test phase, and documentation of bladder function via diary. After a successful test period, a permanent wire and generator are placed in the OR. The battery lasts 5 years on average and would need to be replaced surgically.  The goal of this therapy is at least a 50% improvement in symptoms. It is NOT realistic to expect a 100% cure.  We reviewed the fact that about 30% of patients fail the test phase and are not candidates for permanent generator placement.  We discussed the risk of infection and that the patient would not be able to get an MRI once the device is placed. There are two companies that provide this therapy: Medtronic and [employer]. [employer]' product is new and is similar to Medtronic's, but has advantages of a smaller and rechargeable battery and being able to have an MRI with the implant. For all procedures, we discussed risks of bleeding, infection, damage to surrounding organs including bowel, bladder, blood vessels, ureters and nerves, need for further surgery, risk of postoperative urinary incontinence or  retention with need to catheterize, recurrent prolapse, numbness and weakness at any body site, buttock pain, and the rarer risks of blood clot, heart attack, pneumonia, death.    We also discussed the role of percutaneous tibial nerve stimulation and how it works.  She understands it requires 12 weekly visits for temporary neuromodulation of the sacral nerve roots via the tibial nerve and that she may then require continued tapered treatment.  - declines botox due to risk of urinary retention and restrictions to bathroom access at [employer] - declines PTNS - desires to proceed with SNM. Discussed PNE vs. Stage I, desires to proceed with Stage I. Provided bladder diary for baseline symptoms.

## 2024-03-04 NOTE — Patient Instructions (Signed)
 For refractory OAB we reviewed the procedure for intravesical Botox injection with cystoscopy in the office and reviewed the risks, benefits and alternatives of treatment including but not limited to infection, need for self-catheterization and need for repeat therapy.  We discussed that there is a 5-15% chance of needing to catheterize with Botox and that this usually resolves in a few months; however can persist for longer periods of time.  Typically Botox injections would need to be repeated every 3-12 months since this is not a permanent therapy.   We discussed the role of sacral neuromodulation and how it works. It requires a test phase, and documentation of bladder function via diary. After a successful test period, a permanent wire and generator are placed in the OR. The battery lasts 5 years on average and would need to be replaced surgically.  The goal of this therapy is at least a 50% improvement in symptoms. It is NOT realistic to expect a 100% cure.  We reviewed the fact that about 30% of patients fail the test phase and are not candidates for permanent generator placement.  We discussed the risk of infection and that the patient would not be able to get an MRI once the device is placed. There are two companies that provide this therapy: Medtronic and [employer]. [employer]' product is new and is similar to Medtronic's, but has advantages of a smaller and rechargeable battery and being able to have an MRI with the implant. For all procedures, we discussed risks of bleeding, infection, damage to surrounding organs including bowel, bladder, blood vessels, ureters and nerves, need for further surgery, risk of postoperative urinary incontinence or retention with need to catheterize, recurrent prolapse, numbness and weakness at any body site, buttock pain, and the rarer risks of blood clot, heart attack, pneumonia, death.    We also discussed the role of percutaneous tibial nerve stimulation and how it works.   She understands it requires 12 weekly visits for temporary neuromodulation of the sacral nerve roots via the tibial nerve and that she may then require continued tapered treatment.   Please return for pessary fitting  Continue Gemtesa  1 tab daily.   Constipation: Our goal is to achieve formed bowel movements daily or every-other-day.  You may need to try different combinations of the following options to find what works best for you - everybody's body works differently so feel free to adjust the dosages as needed.  Some options to help maintain bowel health include:  Dietary changes (more leafy greens, vegetables and fruits; less processed foods) Fiber supplementation (Benefiber, FiberCon, Metamucil or Psyllium). Start slow and increase gradually to full dose. Over-the-counter agents such as: stool softeners (Docusate or Colace) and/or laxatives (Miralax, milk of magnesia)  Power Pudding is a natural mixture that may help your constipation.  To make blend 1 cup applesauce, 1 cup wheat bran, and 3/4 cup prune juice, refrigerate and then take 1 tablespoon daily with a large glass of water as needed.   We discussed an office procedure with urethral bulking (Bulkamid). We discussed success rate of approximately 70-80% and possible need for second injection. We reviewed that this is not a permanent procedure and the Bulkamid does become less effective over time. Risks reviewed including injury to bladder or urethra, UTI, urinary retention and hematuria.

## 2024-03-04 NOTE — Assessment & Plan Note (Signed)
-   SUI 2x/week, works at [employer]  - For treatment of stress urinary incontinence,  non-surgical options include expectant management, weight loss, physical therapy, as well as a pessary.  Surgical options include a midurethral sling, Burch urethropexy, and transurethral injection of a bulking agent. - discussed an office procedure with urethral bulking (Bulkamid). We discussed success rate of approximately 70-80% and possible need for second injection. We reviewed that this is not a permanent procedure and the Bulkamid does become less effective over time. Risks reviewed including injury to bladder or urethra, UTI, urinary retention and hematuria.  - offered pessary trial to reassess symptoms, patient desires to proceed at return visit. Declines today due to irritation from catheterization 1 day ago.

## 2024-03-04 NOTE — Progress Notes (Signed)
 [facility] Return Visit  SUBJECTIVE  History of Present Illness: Kathryn Norman is a 43 y.o. female seen in follow-up for overactive bladder. Plan at last visit was trial of Gemtesa .   UUI 1-5x/week, denies leakage today Reports SUI 2x/week, works at [employer] with limited access to restrooms Pad use: 2 pads per day.   Started Gemtesa  yesterday with relief Day time voids 20 down 10x/day.  Nocturia: 6 times/night down to 3x/night Sleeps around 11-12pm,  Reports snoring, denies history of assessment or diagnosis sleep apnea Reports left lower leg swelling with bruising Drinks: 8 cups water/day  Triggered by soda and coffee BM around 3x/week, [place] IV-V attributed to [facility]  Reports straining despite squatting position. Tried linzess, miralax 2 capful/day without relief Urinary leakage at work and at night.   Failed oxybutynin and detrol  (constipation), Myrbetriq  50mg  and Trospium  60mg  ER (no relief) History of lumbar radiculopathy and underwent injections and ongoing PT for back pain  Past Medical History: Patient  has a past medical history of ADHD (attention deficit hyperactivity disorder), Anemia, Anxiety and depression (06/19/2012), Asthma, Chlamydia infection (10/18/2014), Depression, Hemorrhagic ovarian cyst (09/07/2012), and Migraines.   Past Surgical History: She  has a past surgical history that includes Dilation and curettage of uterus and Cholecystectomy.   Medications: She has a current medication list which includes the following prescription(s): albuterol , albuterol , aripiprazole, atomoxetine, qvar  redihaler, diclofenac , famotidine, gabapentin, ibuprofen , meloxicam, methocarbamol , mirtazapine, montelukast , omeprazole, ondansetron , sumatriptan , vibegron , vitamin d (ergocalciferol), wixela inhub, and [DISCONTINUED] cetirizine .   Allergies: Patient is allergic to latex, methocarbamol , acetaminophen , and red dye #40 (allura red).   Social History: Patient   reports that she has quit smoking. Her smoking use included cigarettes. She started smoking about 12 years ago. She has a 1.2 pack-year smoking history. She has never used smokeless tobacco. She reports that she does not drink alcohol and does not use drugs.     OBJECTIVE     Physical Exam: Vitals:   03/04/24 1416  BP: (!) 127/92  Pulse: 72   Gen: No apparent distress, A&O x 3.  Detailed Urogynecologic Evaluation:  Deferred.    ASSESSMENT AND PLAN    Kathryn Norman is a 42 y.o. with:  1. Overactive bladder   2. Nocturia   3. SUI (stress urinary incontinence, female)     Overactive bladder Assessment & Plan: - 02/03/23 POCT UA negative, PVR 0mL - Failed oxybutynin and detrol  (constipation), Myrbetriq  50mg  and Trospium  60mg  ER (no relief), PT without relief - some relief with Gemtesa , cost prohibitive - reports significant impact on QoL due to UUI, mother underwent SNM - For refractory OAB we reviewed the procedure for intravesical Botox injection with cystoscopy in the office and reviewed the risks, benefits and alternatives of treatment including but not limited to infection, need for self-catheterization and need for repeat therapy.  We discussed that there is a 5-15% chance of needing to catheterize with Botox and that this usually resolves in a few months; however can persist for longer periods of time.  Typically Botox injections would need to be repeated every 3-12 months since this is not a permanent therapy.   We discussed the role of sacral neuromodulation and how it works. It requires a test phase, and documentation of bladder function via diary. After a successful test period, a permanent wire and generator are placed in the OR. The battery lasts 5 years on average and would need to be replaced surgically.  The goal of this therapy is  at least a 50% improvement in symptoms. It is NOT realistic to expect a 100% cure.  We reviewed the fact that about 30% of patients fail the  test phase and are not candidates for permanent generator placement.  We discussed the risk of infection and that the patient would not be able to get an MRI once the device is placed. There are two companies that provide this therapy: Medtronic and Axonics. Axonics' product is new and is similar to Medtronic's, but has advantages of a smaller and rechargeable battery and being able to have an MRI with the implant. For all procedures, we discussed risks of bleeding, infection, damage to surrounding organs including bowel, bladder, blood vessels, ureters and nerves, need for further surgery, risk of postoperative urinary incontinence or retention with need to catheterize, recurrent prolapse, numbness and weakness at any body site, buttock pain, and the rarer risks of blood clot, heart attack, pneumonia, death.    We also discussed the role of percutaneous tibial nerve stimulation and how it works.  She understands it requires 12 weekly visits for temporary neuromodulation of the sacral nerve roots via the tibial nerve and that she may then require continued tapered treatment.  - declines botox due to risk of urinary retention and restrictions to bathroom access at [employer] - declines PTNS - desires to proceed with SNM. Discussed PNE vs. Stage I, desires to proceed with Stage I. Provided bladder diary for baseline symptoms.   Nocturia Assessment & Plan: - continue Gemtesa   - avoid fluid intake 3 hours before bedtime - elevated feet during the day or use compression socks to reduce lower extremity swelling - reports snoring, consider sleep study to r/o sleep apnea    SUI (stress urinary incontinence, female) Assessment & Plan: - SUI 2x/week, works at [employer]  - For treatment of stress urinary incontinence,  non-surgical options include expectant management, weight loss, physical therapy, as well as a pessary.  Surgical options include a midurethral sling, Burch urethropexy, and transurethral injection of a  bulking agent. - discussed an office procedure with urethral bulking (Bulkamid). We discussed success rate of approximately 70-80% and possible need for second injection. We reviewed that this is not a permanent procedure and the Bulkamid does become less effective over time. Risks reviewed including injury to bladder or urethra, UTI, urinary retention and hematuria.  - offered pessary trial to reassess symptoms, patient desires to proceed at return visit. Declines today due to irritation from catheterization 1 day ago.     Time spent: I spent 39 minutes dedicated to the care of this patient on the date of this encounter to include pre-visit review of records, face-to-face time with the patient discussing overactive bladder, stress urinary incontinence, nocturia, and post visit documentation and ordering medication/ testing.     Lianne ONEIDA Gillis, MD

## 2024-03-04 NOTE — Assessment & Plan Note (Signed)
-   continue Gemtesa   - avoid fluid intake 3 hours before bedtime - elevated feet during the day or use compression socks to reduce lower extremity swelling - reports snoring, consider sleep study to r/o sleep apnea

## 2024-03-15 ENCOUNTER — Ambulatory Visit: Admitting: Obstetrics and Gynecology

## 2024-03-16 DIAGNOSIS — G8929 Other chronic pain: Secondary | ICD-10-CM | POA: Diagnosis not present

## 2024-03-21 ENCOUNTER — Encounter: Payer: Self-pay | Admitting: Obstetrics and Gynecology

## 2024-03-21 DIAGNOSIS — R351 Nocturia: Secondary | ICD-10-CM

## 2024-03-21 DIAGNOSIS — N3281 Overactive bladder: Secondary | ICD-10-CM

## 2024-03-21 DIAGNOSIS — R350 Frequency of micturition: Secondary | ICD-10-CM

## 2024-03-22 ENCOUNTER — Other Ambulatory Visit: Payer: Self-pay | Admitting: Medical Genetics

## 2024-03-22 ENCOUNTER — Other Ambulatory Visit: Payer: Self-pay | Admitting: Obstetrics and Gynecology

## 2024-03-22 DIAGNOSIS — R351 Nocturia: Secondary | ICD-10-CM

## 2024-03-22 DIAGNOSIS — N3281 Overactive bladder: Secondary | ICD-10-CM

## 2024-03-22 DIAGNOSIS — R350 Frequency of micturition: Secondary | ICD-10-CM

## 2024-03-22 MED ORDER — VIBEGRON 75 MG PO TABS
75.0000 mg | ORAL_TABLET | Freq: Every day | ORAL | 5 refills | Status: DC
Start: 2024-03-22 — End: 2024-07-13

## 2024-03-22 MED ORDER — VIBEGRON 75 MG PO TABS
75.0000 mg | ORAL_TABLET | Freq: Every day | ORAL | Status: DC
Start: 2024-03-22 — End: 2024-03-22

## 2024-03-22 NOTE — Progress Notes (Signed)
 Prescription re-sent to pharmacy at patient request.

## 2024-03-24 DIAGNOSIS — Z5321 Procedure and treatment not carried out due to patient leaving prior to being seen by health care provider: Secondary | ICD-10-CM | POA: Diagnosis not present

## 2024-03-24 DIAGNOSIS — M79642 Pain in left hand: Secondary | ICD-10-CM | POA: Diagnosis not present

## 2024-03-24 DIAGNOSIS — R220 Localized swelling, mass and lump, head: Secondary | ICD-10-CM | POA: Diagnosis not present

## 2024-03-24 DIAGNOSIS — M79641 Pain in right hand: Secondary | ICD-10-CM | POA: Diagnosis not present

## 2024-03-29 ENCOUNTER — Other Ambulatory Visit: Payer: Self-pay | Admitting: Obstetrics and Gynecology

## 2024-03-29 DIAGNOSIS — N3281 Overactive bladder: Secondary | ICD-10-CM

## 2024-03-29 DIAGNOSIS — R351 Nocturia: Secondary | ICD-10-CM

## 2024-03-29 DIAGNOSIS — R350 Frequency of micturition: Secondary | ICD-10-CM

## 2024-03-31 DIAGNOSIS — M545 Low back pain, unspecified: Secondary | ICD-10-CM | POA: Diagnosis not present

## 2024-04-12 DIAGNOSIS — M79645 Pain in left finger(s): Secondary | ICD-10-CM | POA: Diagnosis not present

## 2024-04-13 DIAGNOSIS — M545 Low back pain, unspecified: Secondary | ICD-10-CM | POA: Diagnosis not present

## 2024-04-13 DIAGNOSIS — S39012D Strain of muscle, fascia and tendon of lower back, subsequent encounter: Secondary | ICD-10-CM | POA: Diagnosis not present

## 2024-04-20 ENCOUNTER — Other Ambulatory Visit

## 2024-04-20 DIAGNOSIS — M545 Low back pain, unspecified: Secondary | ICD-10-CM | POA: Diagnosis not present

## 2024-04-20 DIAGNOSIS — S39012D Strain of muscle, fascia and tendon of lower back, subsequent encounter: Secondary | ICD-10-CM | POA: Diagnosis not present

## 2024-05-11 ENCOUNTER — Other Ambulatory Visit: Payer: Self-pay | Admitting: Obstetrics

## 2024-05-11 DIAGNOSIS — R351 Nocturia: Secondary | ICD-10-CM

## 2024-05-11 DIAGNOSIS — N3281 Overactive bladder: Secondary | ICD-10-CM

## 2024-05-11 NOTE — Progress Notes (Signed)
 Surgery referral for Stage I SNM

## 2024-06-16 ENCOUNTER — Other Ambulatory Visit: Payer: Self-pay | Admitting: Medical Genetics

## 2024-06-16 DIAGNOSIS — Z006 Encounter for examination for normal comparison and control in clinical research program: Secondary | ICD-10-CM

## 2024-06-18 NOTE — Progress Notes (Signed)
 I reached out to patient's insurance to gain an approval for Gemtesa . They informed me Gemtesa  doesn't need a PA so I opted to receive ann tier exception. Now waiting on a approval.

## 2024-07-13 ENCOUNTER — Other Ambulatory Visit: Payer: Self-pay | Admitting: Obstetrics

## 2024-07-13 ENCOUNTER — Other Ambulatory Visit: Payer: Self-pay | Admitting: Obstetrics and Gynecology

## 2024-07-13 DIAGNOSIS — R351 Nocturia: Secondary | ICD-10-CM

## 2024-07-13 DIAGNOSIS — N3281 Overactive bladder: Secondary | ICD-10-CM

## 2024-07-13 DIAGNOSIS — R350 Frequency of micturition: Secondary | ICD-10-CM

## 2024-07-13 MED ORDER — GEMTESA 75 MG PO TABS: 1.0000 | ORAL_TABLET | Freq: Every day | ORAL | 5 refills | Status: DC

## 2024-07-13 NOTE — Progress Notes (Signed)
 Stage II interstim request sent

## 2024-07-13 NOTE — Addendum Note (Signed)
 Addended by: Leonell Lobdell N on: 07/13/2024 04:49 PM   Modules accepted: Orders

## 2024-07-16 DIAGNOSIS — G8929 Other chronic pain: Secondary | ICD-10-CM | POA: Diagnosis not present

## 2024-07-26 ENCOUNTER — Encounter: Payer: Self-pay | Admitting: Neurology

## 2024-08-24 ENCOUNTER — Encounter (HOSPITAL_COMMUNITY): Payer: Self-pay | Admitting: Obstetrics

## 2024-08-24 NOTE — Progress Notes (Signed)
 Spoke w/ via phone for pre-op interview--- Kathryn Norman needs dos----   UPT per anesthesia. Surgeon orders requested 08/23/24.      Norman results------ COVID test -----patient states asymptomatic no test needed Arrive at -------0830 NPO after MN NO Solid Food.  Clear liquids from MN until---0730 Pre-Surgery Ensure or G2:  Med rec completed Medications to take morning of surgery -----All inhlaers, bring Albuterol . Pepcid, Gabapentin and Gemtesa .  Diabetic medication -----  GLP1 agonist last dose: GLP1 instructions:  Patient instructed no nail polish to be worn day of surgery Patient instructed to bring photo id and insurance card day of surgery Patient aware to have Driver (ride ) / caregiver    for 24 hours after surgery - Daughter Psychologist, Occupational Patient Special Instructions ----- Pre-Op special Instructions -----  Patient verbalized understanding of instructions that were given at this phone interview. Patient denies chest pain, sob, fever, cough at the interview.

## 2024-08-31 NOTE — H&P (Signed)
 [facility] Pre-Operative H&P  Subjective Chief Complaint: Kathryn Norman presents for a preoperative encounter.   History of Present Illness: Kathryn Norman is a 44 y.o. female who presents for preoperative visit.  She is scheduled to undergo Stage I sacral neuromodulation with fluoroscopy today.  Her symptoms include refractory overactive bladder.  UUI 1-5x/week  ***diary Reports SUI 2x/week, works at [employer] with limited access to [employer] use: 2 pads per day.   Using Gemtesa  *** Day time voids 20 down 10x/day.  Nocturia: 6 times/night down to 3x/night Sleeps around 11-12pm Reports snoring, denies history of assessment or diagnosis sleep apnea Reports left lower leg swelling with bruising Drinks: 8 cups water/day  Triggered by soda and coffee BM around 3x/week, [place] IV-V attributed to [facility]  Reports straining despite squatting position. Tried linzess, miralax 2 capful/day without relief Urinary leakage at work and at night.   Failed oxybutynin and detrol  (constipation), Myrbetriq  50mg  and Trospium  60mg  ER (no relief) History of lumbar radiculopathy and underwent injections and ongoing PT for back pain  Past Medical History:  Diagnosis Date   ADHD (attention deficit hyperactivity disorder)    Anemia    Anxiety and depression 06/19/2012   Asthma    Chlamydia infection 10/18/2014   Treated   Depression    Hemorrhagic ovarian cyst 09/07/2012   Migraines      Past Surgical History:  Procedure Laterality Date   CHOLECYSTECTOMY     DILATION AND CURETTAGE OF UTERUS     x 2    is allergic to latex, methocarbamol , acetaminophen , and red dye #40 (allura red).   Family History  Problem Relation Age of Onset   Migraines Mother    Alopecia Mother    Sickle cell trait Mother    Lung cancer Father        died of lung cancer   Sickle cell anemia Sister    Seizures Brother    Uterine cancer Neg Hx    Bladder Cancer Neg Hx    Renal cancer Neg Hx      Social History[1]   Review of Systems was negative for a full 10 system review except as noted in the History of Present Illness.  Current Medications[2]   Objective There were no vitals filed for this visit.  Gen: NAD CV: S1 S2 RRR Lungs: Clear to auscultation bilaterally Abd: soft, nontender Pelvic exam deferred   Lab Results  Component Value Date   WBC 5.8 07/25/2022   HGB 13.8 07/25/2022   HCT 40.9 07/25/2022   MCV 91 07/25/2022   PLT 240 07/25/2022   Lab Results  Component Value Date   CREATININE 0.92 07/25/2022     Assessment/ Plan  Assessment: The patient is a 44 y.o. year old scheduled to undergo Stage I sacral neuromodulation with fluoroscopy. Written consent was obtained for these procedures.  Plan: General Surgical Consent: The patient has previously been counseled on alternative treatments, and the decision by the patient and provider was to proceed with the procedure listed above.  We discussed the role of sacral neuromodulation and how it works. It requires a test phase, and documentation of bladder function via diary. After a successful test period, a permanent wire and generator are placed in the OR. The battery lasts 5 years on average and would need to be replaced surgically.  The goal of this therapy is at least a 50% improvement in symptoms. It is NOT realistic to expect a 100% cure.  We reviewed  the fact that about 30% of patients fail the test phase and are not candidates for permanent generator placement.  We discussed the risk of infection and that the patient would have to switch to MRI compatible mode during imaging once the device is placed. There are two companies that provide this therapy: Medtronic and [employer] that both offer rechargeable or non-rechargeable options. For all procedures, we discussed risks of bleeding, infection, damage to surrounding organs including bowel, bladder, blood vessels, ureters and nerves, need for further surgery,  risk of postoperative urinary incontinence or retention with need to catheterize, recurrent prolapse, numbness and weakness at any body site, buttock pain, and the rarer risks of blood clot, heart attack, pneumonia, death.    For all procedures, there are risks of bleeding, infection, damage to surrounding organs including but not limited to bowel, bladder, blood vessels, ureters and nerves, and need for further surgery if an injury were to occur. These risks are all low with minimally invasive surgery.   There are risks of numbness and weakness at any body site or buttock/rectal pain.  It is possible that baseline pain can be worsened by surgery, either with or without mesh. If surgery is vaginal, there is also a low risk of possible conversion to laparoscopy or open abdominal incision where indicated. Very rare risks include blood transfusion, blood clot, heart attack, pneumonia, or death.   There is also a risk of worsening of overactive bladder.   We discussed consent for blood products. Risks for blood transfusion include allergic reactions, other reactions that can affect different body organs and managed accordingly, transmission of infectious diseases such as HIV or Hepatitis. However, the blood is screened. Patient consents for blood products.  Pre-operative instructions:  She was instructed to not take Aspirin/NSAIDs x 7days prior to surgery.  Antibiotic prophylaxis was ordered as indicated.  Post-operative instructions:  She was provided with specific post-operative instructions, including precautions and signs/symptoms for which we would recommend contacting us , in addition to daytime and after-hours contact phone numbers. This was provided on a handout.   Post-operative medications: ***Prescriptions for motrin , miralax, and oxycodone  were sent to her pharmacy. Discussed using ibuprofen  and tylenol  on a schedule to limit use of narcotics.   Laboratory testing:  We will check labs: per  anesthesia. Day of surgery UPT ***  Preoperative clearance:  She does not require surgical clearance.    Post-operative follow-up:  A post-operative appointment will be made for 6 weeks from the date of surgery. If she needs a post-operative nurse visit for a voiding trial, that will be set up after she leaves the hospital.    Patient will call the clinic or use MyChart should anything change or any new issues arise.   Lianne ONEIDA Gillis, MD    [1]  Social History Tobacco Use   Smoking status: Former    Current packs/day: 0.10    Average packs/day: 0.1 packs/day for 12.9 years (1.3 ttl pk-yrs)    Types: Cigarettes    Start date: 10/21/2011   Smokeless tobacco: Never  Vaping Use   Vaping status: Never Used  Substance Use Topics   Alcohol  use: No    Alcohol /week: 0.0 standard drinks of alcohol    Drug use: No  [2] No current facility-administered medications for this encounter.  Current Outpatient Medications:    ARIPiprazole (ABILIFY) 10 MG tablet, Take 10 mg by mouth daily., Disp: , Rfl:    beclomethasone (QVAR  REDIHALER) 80 MCG/ACT inhaler, Inhale 1 puff into the  lungs 2 (two) times daily., Disp: 10.6 g, Rfl: 1   famotidine (PEPCID) 20 MG tablet, Take 20 mg by mouth at bedtime as needed., Disp: , Rfl:    gabapentin (NEURONTIN) 100 MG capsule, Take by mouth., Disp: , Rfl:    meloxicam (MOBIC) 7.5 MG tablet, 1 tablet Orally Once a day, Disp: , Rfl:    montelukast  (SINGULAIR ) 10 MG tablet, Take 1 tablet (10 mg total) by mouth at bedtime., Disp: 30 tablet, Rfl: 2   Vibegron  (GEMTESA ) 75 MG TABS, Take 1 tablet (75 mg total) by mouth daily., Disp: 30 tablet, Rfl: 5   Vitamin D, Ergocalciferol, 50000 units CAPS, Take one capsule by mouth weekly and the switch to 1000-2000 unit maintenance dosing Oral, Disp: , Rfl:    WIXELA INHUB 500-50 MCG/ACT AEPB, INHALE 1 PUFF INTO THE LUNGS TWICE A DAY FOR 30 DAYS Inhalation for 30 Days, Disp: , Rfl:    albuterol  (PROAIR  HFA) 108 (90 Base) MCG/ACT  inhaler, Inhale 1-2 puffs into the lungs every 6 (six) hours as needed for wheezing or shortness of breath., Disp: 8 g, Rfl: 0   albuterol  (PROVENTIL ) (2.5 MG/3ML) 0.083% nebulizer solution, Take 3 mLs (2.5 mg total) by nebulization every 6 (six) hours as needed for wheezing or shortness of breath., Disp: 75 mL, Rfl: 0   atomoxetine (STRATTERA) 40 MG capsule, Take 40 mg by mouth daily. (Patient not taking: Reported on 08/24/2024), Disp: , Rfl:    diclofenac  (VOLTAREN ) 75 MG EC tablet, Take 75 mg by mouth 2 (two) times daily as needed. (Patient not taking: Reported on 08/24/2024), Disp: , Rfl:    ibuprofen  (ADVIL ) 800 MG tablet, Take 1 tablet (800 mg total) by mouth every 8 (eight) hours as needed., Disp: 30 tablet, Rfl: 0   methocarbamol  (ROBAXIN ) 500 MG tablet, Take 500 mg by mouth every 8 (eight) hours as needed. (Patient not taking: Reported on 08/24/2024), Disp: , Rfl:    mirtazapine (REMERON) 15 MG tablet, Take 15 mg by mouth at bedtime., Disp: , Rfl:    omeprazole (PRILOSEC) 40 MG capsule, Take one tablet daily to control reflux symptoms, if not controlled can increase to twice daily. Oral, Disp: , Rfl:    ondansetron  (ZOFRAN -ODT) 4 MG disintegrating tablet, Take 1 tablet (4 mg total) by mouth every 8 (eight) hours as needed for nausea or vomiting., Disp: 20 tablet, Rfl: 0   SUMAtriptan  (IMITREX ) 100 MG tablet, Take 1 tablet (100 mg total) by mouth once as needed for up to 1 dose for migraine. May repeat in 2 hours if headache persists or recurs., Disp: 9 tablet, Rfl: 2

## 2024-09-01 ENCOUNTER — Encounter: Payer: Self-pay | Admitting: Obstetrics

## 2024-09-01 ENCOUNTER — Ambulatory Visit (HOSPITAL_COMMUNITY): Admitting: Anesthesiology

## 2024-09-01 ENCOUNTER — Ambulatory Visit (HOSPITAL_COMMUNITY)
Admission: RE | Admit: 2024-09-01 | Discharge: 2024-09-01 | Disposition: A | Discharge status: Home / Self Care | Attending: Obstetrics | Admitting: Obstetrics

## 2024-09-01 ENCOUNTER — Encounter (HOSPITAL_COMMUNITY): Admission: RE | Disposition: A | Payer: Self-pay | Source: Home / Self Care | Attending: Obstetrics

## 2024-09-01 ENCOUNTER — Encounter (HOSPITAL_COMMUNITY): Payer: Self-pay | Admitting: Obstetrics

## 2024-09-01 ENCOUNTER — Other Ambulatory Visit (HOSPITAL_COMMUNITY): Payer: Self-pay

## 2024-09-01 ENCOUNTER — Ambulatory Visit (HOSPITAL_COMMUNITY)

## 2024-09-01 DIAGNOSIS — I10 Essential (primary) hypertension: Secondary | ICD-10-CM | POA: Diagnosis not present

## 2024-09-01 DIAGNOSIS — F319 Bipolar disorder, unspecified: Secondary | ICD-10-CM | POA: Diagnosis not present

## 2024-09-01 DIAGNOSIS — R351 Nocturia: Secondary | ICD-10-CM

## 2024-09-01 DIAGNOSIS — J45909 Unspecified asthma, uncomplicated: Secondary | ICD-10-CM | POA: Diagnosis not present

## 2024-09-01 DIAGNOSIS — G8918 Other acute postprocedural pain: Secondary | ICD-10-CM

## 2024-09-01 DIAGNOSIS — N3281 Overactive bladder: Secondary | ICD-10-CM | POA: Insufficient documentation

## 2024-09-01 DIAGNOSIS — Z01818 Encounter for other preprocedural examination: Secondary | ICD-10-CM

## 2024-09-01 DIAGNOSIS — F419 Anxiety disorder, unspecified: Secondary | ICD-10-CM | POA: Insufficient documentation

## 2024-09-01 LAB — POCT PREGNANCY, URINE: Preg Test, Ur: NEGATIVE

## 2024-09-01 LAB — NO BLOOD PRODUCTS

## 2024-09-01 MED ORDER — BUPIVACAINE HCL (PF) 0.25 % IJ SOLN
INTRAMUSCULAR | Status: AC
  Filled 2024-09-01: qty 30

## 2024-09-01 MED ORDER — ONDANSETRON HCL 4 MG/2ML IJ SOLN
INTRAMUSCULAR | Status: AC
  Filled 2024-09-01: qty 2

## 2024-09-01 MED ORDER — ORAL CARE MOUTH RINSE: 15.0000 mL | Freq: Once | OROMUCOSAL | Status: DC

## 2024-09-01 MED ORDER — POVIDONE-IODINE 10 % EX SWAB: 2.0000 | Freq: Once | CUTANEOUS | Status: DC

## 2024-09-01 MED ORDER — MIDAZOLAM HCL 2 MG/2ML IJ SOLN
INTRAMUSCULAR | Status: AC
  Filled 2024-09-01: qty 2

## 2024-09-01 MED ORDER — DEXAMETHASONE SOD PHOSPHATE PF 10 MG/ML IJ SOLN
INTRAMUSCULAR | Status: AC
  Filled 2024-09-01: qty 1

## 2024-09-01 MED ORDER — OXYCODONE HCL 5 MG PO TABS
5.0000 mg | ORAL_TABLET | ORAL | 0 refills | Status: DC | PRN
  Filled 2024-09-01: qty 5, 1d supply, fill #0

## 2024-09-01 MED ORDER — BUPIVACAINE HCL (PF) 0.5 % IJ SOLN
INTRAMUSCULAR | Status: AC
  Filled 2024-09-01: qty 30

## 2024-09-01 MED ORDER — ONDANSETRON HCL 4 MG/2ML IJ SOLN: 4.0000 mg | Freq: Four times a day (QID) | INTRAMUSCULAR | Status: DC | PRN

## 2024-09-01 MED ORDER — DEXAMETHASONE SOD PHOSPHATE PF 10 MG/ML IJ SOLN
INTRAMUSCULAR | Status: DC | PRN
  Administered 2024-09-01: 10 mg via INTRAVENOUS

## 2024-09-01 MED ORDER — PROPOFOL 10 MG/ML IV BOLUS
INTRAVENOUS | Status: AC
  Filled 2024-09-01: qty 20

## 2024-09-01 MED ORDER — GENTAMICIN SULFATE 40 MG/ML IJ SOLN
5.0000 mg/kg | INTRAVENOUS | Status: AC
  Administered 2024-09-01: 270 mg via INTRAVENOUS
  Filled 2024-09-01: qty 6.75

## 2024-09-01 MED ORDER — ONDANSETRON HCL 4 MG/2ML IJ SOLN
INTRAMUSCULAR | Status: DC | PRN
  Administered 2024-09-01: 4 mg via INTRAVENOUS

## 2024-09-01 MED ORDER — CEFAZOLIN SODIUM-DEXTROSE 2-4 GM/100ML-% IV SOLN
2.0000 g | INTRAVENOUS | Status: AC
  Administered 2024-09-01: 2 g via INTRAVENOUS

## 2024-09-01 MED ORDER — PROPOFOL 10 MG/ML IV BOLUS
INTRAVENOUS | Status: DC | PRN
  Administered 2024-09-01 (×2): 30 mg via INTRAVENOUS
  Administered 2024-09-01 (×2): 20 mg via INTRAVENOUS

## 2024-09-01 MED ORDER — MIDAZOLAM HCL (PF) 2 MG/2ML IJ SOLN
INTRAMUSCULAR | Status: DC | PRN
  Administered 2024-09-01: 2 mg via INTRAVENOUS

## 2024-09-01 MED ORDER — FENTANYL CITRATE (PF) 100 MCG/2ML IJ SOLN: 25.0000 ug | INTRAMUSCULAR | Status: DC | PRN

## 2024-09-01 MED ORDER — FENTANYL CITRATE (PF) 100 MCG/2ML IJ SOLN
INTRAMUSCULAR | Status: AC
  Filled 2024-09-01: qty 2

## 2024-09-01 MED ORDER — KETOROLAC TROMETHAMINE 30 MG/ML IJ SOLN: 30.0000 mg | INTRAMUSCULAR | Status: DC

## 2024-09-01 MED ORDER — CHLORHEXIDINE GLUCONATE 0.12 % MT SOLN
OROMUCOSAL | Status: AC
  Administered 2024-09-01: 15 mL
  Filled 2024-09-01: qty 15

## 2024-09-01 MED ORDER — CHLORHEXIDINE GLUCONATE 0.12 % MT SOLN: 15.0000 mL | Freq: Once | OROMUCOSAL | Status: DC

## 2024-09-01 MED ORDER — LIDOCAINE 2% (20 MG/ML) 5 ML SYRINGE
INTRAMUSCULAR | Status: AC
  Filled 2024-09-01: qty 5

## 2024-09-01 MED ORDER — FENTANYL CITRATE (PF) 100 MCG/2ML IJ SOLN
INTRAMUSCULAR | Status: DC | PRN
  Administered 2024-09-01 (×2): 50 ug via INTRAVENOUS

## 2024-09-01 MED ORDER — BUPIVACAINE HCL 0.25 % IJ SOLN
INTRAMUSCULAR | Status: DC | PRN
  Administered 2024-09-01: 34 mL

## 2024-09-01 MED ORDER — LIDOCAINE 2% (20 MG/ML) 5 ML SYRINGE
INTRAMUSCULAR | Status: DC | PRN
  Administered 2024-09-01: 100 mg via INTRAVENOUS

## 2024-09-01 MED ORDER — CEFAZOLIN SODIUM-DEXTROSE 2-4 GM/100ML-% IV SOLN
INTRAVENOUS | Status: AC
  Filled 2024-09-01: qty 100

## 2024-09-01 MED ORDER — PROPOFOL 500 MG/50ML IV EMUL
INTRAVENOUS | Status: DC | PRN
  Administered 2024-09-01: 150 ug/kg/min via INTRAVENOUS

## 2024-09-01 MED ORDER — LACTATED RINGERS IV SOLN: INTRAVENOUS | Status: DC

## 2024-09-01 SURGICAL SUPPLY — 4 items
KIT LEAD TINED W/STYLET (Lead) IMPLANT
LEAD INTERSTIM 4.32 28 L (Lead) IMPLANT
NEUROSTIM NON-RECHARGE (Neurostimulator) IMPLANT
STIMULATOR INTERSTIM 2X1.7X.3 (Miscellaneous) IMPLANT

## 2024-09-01 NOTE — Op Note (Addendum)
 Operative Note  Preoperative Diagnosis: Refractory overactive bladder  Postoperative Diagnosis: Refractory overactive bladder  Procedures performed:  Stage I sacral neuromodulation, fluoroscopy  Implants:  Implant Name Type Inv. Item Serial No. Manufacturer Lot No. LRB No. Used Action  LEAD INTERSTIM 4.32 28 L - [id number] Lead LEAD INTERSTIM 4.32 28 L  MEDTRONIC NEUROMOD PAIN MGMT [id number] N/A 1 Implanted  Medtronic Temporary Implant     [id number] N/A 1 Implanted    Attending Surgeon: Lianne Leila Gillis, MD  Assistant Surgeon: n/a  Assistant: Janell, Marina   Anesthesia: MAC  Findings: 1. Bellows reflex and dorsiflexion of great toe noted from placement of left lead. Placement into S3 foramen was confirmed on fluoroscopy Set to Program 3 at 1.77mA   Specimens: * No specimens in log *  Estimated blood loss: 5 mL  IV fluids: 156 mL  Urine output: 0 mL  Complications: none  Procedure in Detail:  After informed consent was obtained, the patient was taken to the operating room where anesthesia was induced and found to be adequate. Patient was placed in the prone position with adequate padding to flatten the sacrum and under the shins to allow the toes to dangle freely. She was then prepped and draped in the usual sterile fashion. The C-arm was positioned in the AP and lateral positions to provide fluoroscopic mapping of the sacral region. Spinal needles in laid in parallel were used to identify the foramen landmarks.The S3 foramen was identified. Local injection of 0.25% bupivacaine  was injected down each side to the level of the sacrum. The foramen needle was placed at a 60 degree angle into the S3 foramen on the left. Proper needle depth was confirmed with fluoroscopy. This was repeated on the right side.  Position was confirmed by direct observation of bellowing and plantar flexion of the toe utilizing the external test stimulator. Optimal response was noted on the left side, so the right  side needle was removed.   The foramen needle stylet was removed and guidewire was placed. An incision was made with an 11-blade scalpel and the introducer was placed over the guidewire. Proper depth was confirmed with fluoroscopy until the opaque mark of the dilator was seen on the anterior rim of the sacrum with fluoroscopy.  The obturator was unlocked and removed and the lead was placed through the introducer sheath to the first white line.  Position was checked fluoroscopically.  The lead was then introduced until three electrodes were visible below the sacrum.  Each electrode was tested for visualization of the bellows and plantar flexion of the great toe.  After satisfactory positioning was confirmed under fluoroscopy, the introducer sheath was retracted deploying the lead ties into the perisacral tissues under live fluoroscopy.    Attention was turned to the patient's right where anesthetic was injected at a point previously marked on the buttocks, below the iliac crest and below the belt line. The skin was injected with 0.25% bupivacaine  and an incision was made with a 15 blade scalpel. Further dissection was made into the subcutaneous tissue, allowing for a sufficient pocket for the generator to later be placed.  A tunneling tool and tube were placed from the sacral lead subcutaneously to the incised pocket site.  The tunneling tool was removed and the lead was sent through the tube and pulled out at the pocket site. The tunneler was then used to make a second tunnel and brought out of the skin superomedial to the gluteal incision. The extension wire was placed  in the tunneller from the external site and brought back into the gluteal incision. The lead was cleansed of bodily fluids and attached to the extension wire and screwed in place. The connector with wire was secured by making a loose knot in it to prevent migration through the external tunneling site.  The wires and connector were buried in the  pocket. Smaller skin incisions were closed with 2-0 vicryl subcutaneously and interrupted 4-0 monocryl sutures. The pocket with closed with 2-0 vicryl subcutaneously and the skin was closed with 4-0 Monocryl in an interrupted fashion. Telfa and tegaderm were placed over the incisions. The patient tolerated the procedure well and was taken to the recovery room in stable condition. Needle and sponge count was correct x2.

## 2024-09-01 NOTE — Discharge Instructions (Signed)
 We discussed the role of InterStim sacral neuromodulation and how it works. It requires a test phase, and documentation of bladder function via diary. After a successful test period, a permanent wire and generator are placed in the OR. The battery lasts 5-7 years on average and would need to be replaced surgically but there is also a rechargeable option.  The goal of this therapy is at least a 50% improvement in symptoms. It is NOT realistic to expect a 100% cure.  We reviewed the fact that about 30% of patients fail the test phase and are not candidates for permanent generator placement.  We discussed the risk of infection and that the patient would be able to get an MRI once the device is placed.    POST OPERATIVE INSTRUCTIONS  General Instructions Recovery (not bed rest) will last approximately 6 weeks Walking is encouraged, but refrain from strenuous exercise/ housework/ heavy lifting. No lifting >10lbs  Bathing:  Do not shower until Stage II interstim or removal of permanent lead placement No driving until you are not taking narcotic pain medicine and until your pain is well enough controlled that you can slam on the breaks or make sudden movements if needed.   Taking your medications Please take your acetaminophen  and ibuprofen  on a schedule for the first 48 hours. Take 600mg  ibuprofen , then take 500mg  acetaminophen  3 hours later, then continue to alternate ibuprofen  and acetaminophen . That way you are taking each type of medication every 6 hours. Take the prescribed narcotic (oxycodone , tramadol , etc) as needed, with a maximum being every 4 hours.  Take a stool softener daily to keep your stools soft and preventing you from straining. If you have diarrhea, you decrease your stool softener. This is explained more below. We have prescribed you Miralax.  Reasons to Call the Nurse (see last page for phone numbers) Heavy Bleeding (changing your pad every 1-2 hours) Persistent  nausea/vomiting Fever (100.4 degrees or more) Incision problems (pus or other fluid coming out, redness, warmth, increased pain)  Things to Expect After Surgery Mild to Moderate pain is normal during the first day or two after surgery. If prescribed, take Ibuprofen  or Tylenol  first and use the stronger medicine for break-through pain. You can overlap these medicines because they work differently.   Constipation   To Prevent Constipation:  Eat a well-balanced diet including protein, grains, fresh fruit and vegetables.  Drink plenty of fluids. Walk regularly.  Depending on specific instructions from your physician: take Miralax daily and additionally you can add a stool softener (colace/ docusate) and fiber supplement. Continue as long as you're on pain medications.   To Treat Constipation:  If you do not have a bowel movement in 2 days after surgery, you can take 2 Tbs of Milk of Magnesia 1-2 times a day until you have a bowel movement. If diarrhea occurs, decrease the amount or stop the laxative. If no results with Milk of Magnesia, you can drink a bottle of magnesium citrate which you can purchase over the counter.  Fatigue:  This is a normal response to surgery and will improve with time.  Plan frequent rest periods throughout the day.  Gas Pain:  This is very common but can also be very painful! Drink warm liquids such as herbal teas, bouillon or soup. Walking will help you pass more gas.  Mylicon or Gas-X can be taken over the counter.  Leaking Urine:  Varying amounts of leakage may occur after surgery.  This should improve with  time. Your bladder needs at least 3 months to recover from surgery. If you leak after surgery, be sure to mention this to your doctor at your post-op visit. If you were taking medications for overactive bladder prior to surgery, be sure to restart the medications immediately after surgery.  Incisions: If you have incisions on your abdomen, the skin glue will dissolve  on its own over time. It is ok to gently rinse with soap and water over these incisions but do not scrub.  Return to Work  As work demands and recovery times vary widely, it is hard to predict when you will want to return to work. If you have a desk job with no strenuous physical activity, and if you would like to return sooner than generally recommended, discuss this with your provider or call our office.   Post op concerns  For non-emergent issues, please call the Urogynecology Nurse. Please leave a message and someone will contact you within one business day.  You can also send a message through MyChart.   AFTER HOURS (After 5:00 PM and on weekends):  For urgent matters that cannot wait until the next business day. Call our office [phone] and connect to the doctor on call.  Please reserve this for important issues.   **FOR ANY TRUE EMERGENCY ISSUES CALL 911 OR GO TO THE NEAREST EMERGENCY ROOM.** Please inform our office or the doctor on call of any emergency.     APPOINTMENTS: Call [phone]

## 2024-09-01 NOTE — Transfer of Care (Signed)
 Immediate Anesthesia Transfer of Care Note  Patient: Kathryn Norman  Procedure(s) Performed: INSERTION, NEUROSTIMULATOR, SACRAL (Buttocks)  Patient Location: PACU  Anesthesia Type:MAC  Level of Consciousness: drowsy and confused  Airway & Oxygen  Therapy: Patient Spontanous Breathing  Post-op Assessment: Report given to RN and Post -op Vital signs reviewed and stable  Post vital signs: Reviewed and stable  Last Vitals:  Vitals Value Taken Time  BP 114/80 09/01/24 13:00  Temp    Pulse 92 09/01/24 13:00  Resp 25 09/01/24 13:00  SpO2 98 % 09/01/24 13:00  Vitals shown include unfiled device data.  Last Pain:  Vitals:   09/01/24 0854  TempSrc: Oral  PainSc: 0-No pain      Patients Stated Pain Goal: 3 (09/01/24 0854)  Complications: No notable events documented.

## 2024-09-01 NOTE — Anesthesia Preprocedure Evaluation (Signed)
"                                    Anesthesia Evaluation  Patient identified by MRN, date of birth, ID band Patient awake    Reviewed: Allergy & Precautions, H&P , NPO status , Patient's Chart, lab work & pertinent test results  Airway Mallampati: II   Neck ROM: full    Dental   Pulmonary asthma , former smoker   breath sounds clear to auscultation       Cardiovascular negative cardio ROS  Rhythm:regular Rate:Normal     Neuro/Psych  Headaches PSYCHIATRIC DISORDERS Anxiety Depression Bipolar Disorder      GI/Hepatic   Endo/Other    Renal/GU      Musculoskeletal   Abdominal   Peds  Hematology   Anesthesia Other Findings   Reproductive/Obstetrics                              Anesthesia Physical Anesthesia Plan  ASA: 2  Anesthesia Plan: MAC   Post-op Pain Management:    Induction: Intravenous  PONV Risk Score and Plan: 2 and Propofol  infusion and Treatment may vary due to age or medical condition  Airway Management Planned: Simple Face Mask  Additional Equipment:   Intra-op Plan:   Post-operative Plan:   Informed Consent: I have reviewed the patients History and Physical, chart, labs and discussed the procedure including the risks, benefits and alternatives for the proposed anesthesia with the patient or authorized representative who has indicated his/her understanding and acceptance.     Dental advisory given  Plan Discussed with: CRNA, Anesthesiologist and Surgeon  Anesthesia Plan Comments:         Anesthesia Quick Evaluation  "

## 2024-09-02 MED ORDER — ONDANSETRON HCL 4 MG PO TABS: 4.0000 mg | ORAL_TABLET | Freq: Three times a day (TID) | ORAL | 0 refills | Status: AC | PRN

## 2024-09-02 MED ORDER — IBUPROFEN 800 MG PO TABS: 800.0000 mg | ORAL_TABLET | Freq: Three times a day (TID) | ORAL | 0 refills | Status: DC | PRN

## 2024-09-02 NOTE — Anesthesia Postprocedure Evaluation (Signed)
"   Anesthesia Post Note  Patient: Kathryn Norman  Procedure(s) Performed: INSERTION, NEUROSTIMULATOR, SACRAL (Buttocks)     Patient location during evaluation: PACU Anesthesia Type: MAC Level of consciousness: awake and alert Pain management: pain level controlled Vital Signs Assessment: post-procedure vital signs reviewed and stable Respiratory status: spontaneous breathing, nonlabored ventilation, respiratory function stable and patient connected to nasal cannula oxygen  Cardiovascular status: stable and blood pressure returned to baseline Postop Assessment: no apparent nausea or vomiting Anesthetic complications: no   No notable events documented.  Last Vitals:  Vitals:   09/01/24 1300 09/01/24 1315  BP: 114/80 (!) 146/94  Pulse: 87 75  Resp: 17 18  Temp: 36.5 C 36.6 C  SpO2: 98% 100%    Last Pain:  Vitals:   09/01/24 1315  TempSrc:   PainSc: 0-No pain                 Cassandra Harbold S      "

## 2024-09-03 ENCOUNTER — Ambulatory Visit: Admitting: Obstetrics

## 2024-09-03 ENCOUNTER — Encounter: Payer: Self-pay | Admitting: Obstetrics

## 2024-09-03 ENCOUNTER — Telehealth: Payer: Self-pay | Admitting: *Deleted

## 2024-09-03 VITALS — BP 149/94 | HR 66

## 2024-09-03 DIAGNOSIS — G8918 Other acute postprocedural pain: Secondary | ICD-10-CM | POA: Insufficient documentation

## 2024-09-03 DIAGNOSIS — Z48816 Encounter for surgical aftercare following surgery on the genitourinary system: Secondary | ICD-10-CM

## 2024-09-03 NOTE — Progress Notes (Signed)
 [facility]  Date of Visit: 09/03/2024  History of Present Illness: Kathryn Norman is a 44 y.o. female scheduled today for a post-operative visit.   Surgery: s/p stage I interstim on 09/01/24  Postoperative course was uncomplicated.   Today she reports reduction in urinary symptoms and only got up 1x at night to void Voids  3x/day, reduction of urinary leakage managed with 1 pad/day Taking Ibuprofen  800mg  twice a day  Unable to take Tylenol  due to red dye allergy Stopped using baseline Gabapentin 100mg  at night. Denies pain in prone position. Reports difficulty with sitting due to incisional pain.  Using oxycodone  1 tab/day Rx Zofran  POD#1 due to nausea with narcotic use.  Medications: She has a current medication list which includes the following prescription(s): albuterol , albuterol , aripiprazole, atomoxetine, qvar  redihaler, diclofenac , famotidine, gabapentin, ibuprofen , ibuprofen , meloxicam, methocarbamol , mirtazapine, montelukast , omeprazole, ondansetron , oxycodone , sumatriptan , vitamin d (ergocalciferol), wixela inhub, and [DISCONTINUED] cetirizine .   Allergies: Patient is allergic to amoxicillin , latex, methocarbamol , acetaminophen , and red dye #40 (allura red).   Physical Exam: BP (!) 149/94   Pulse 66   LMP  (LMP Unknown)    Physical Exam Constitutional:      General: She is not in acute distress.    Appearance: Normal appearance.  Cardiovascular:     Rate and Rhythm: Normal rate.  Pulmonary:     Effort: Pulmonary effort is normal. No respiratory distress.  Musculoskeletal:       Legs:     Comments: No erythema, discharge, or wound separation. Sutures intact.  Reapplied gauze and Tegaderm over incisions.   Neurological:     Mental Status: She is alert.  Vitals reviewed. Exam conducted with a chaperone present.     ---------------------------------------------------------  Assessment and Plan:  1. Postoperative pain    Postoperative pain Assessment  & Plan: - encouraged to increase Ibuprofen  800mg  to 3 times a day with meals - resume Gabapentin 100mg  at bedtime, increase to 200mg  if no daytime sedation - encouraged pillow use for sitting - no wound complication or infection noted on exam today, return to the office if clinical change - continue oxycodone  as needed, call office for refill of pain medication if pain persist or worsens     Can resume regular activity avoiding strenuous exercise - no showers or baths until after Stage II interstim placement - Discussed avoidance of heavy lifting and straining  All questions answered.   Return in about 2 weeks (around 09/17/2024), or if symptoms worsen or fail to improve.

## 2024-09-03 NOTE — Patient Instructions (Signed)
 Increase Ibuprofen  to 800mg  three times a day.   Increase gabapentin to 200mg  at night.   Continue oxycodone  as needed for pain.   Please start pillow use for comfort as needed.   Call if you continue to experience incisional pain after completion of oxycodone .

## 2024-09-03 NOTE — Telephone Encounter (Signed)
 Pt called the wrong number

## 2024-09-03 NOTE — Assessment & Plan Note (Signed)
-   encouraged to increase Ibuprofen  800mg  to 3 times a day with meals - resume Gabapentin 100mg  at bedtime, increase to 200mg  if no daytime sedation - encouraged pillow use for sitting - no wound complication or infection noted on exam today, return to the office if clinical change - continue oxycodone  as needed, call office for refill of pain medication if pain persist or worsens

## 2024-09-06 DIAGNOSIS — Z0289 Encounter for other administrative examinations: Secondary | ICD-10-CM

## 2024-09-08 ENCOUNTER — Telehealth: Payer: Self-pay

## 2024-09-08 NOTE — Telephone Encounter (Signed)
 FMLA paperwork has been completed. Awaiting payment.

## 2024-09-09 ENCOUNTER — Ambulatory Visit: Admitting: Obstetrics

## 2024-09-09 VITALS — BP 123/85 | HR 91

## 2024-09-09 DIAGNOSIS — Z48816 Encounter for surgical aftercare following surgery on the genitourinary system: Secondary | ICD-10-CM

## 2024-09-09 DIAGNOSIS — Z4800 Encounter for change or removal of nonsurgical wound dressing: Secondary | ICD-10-CM | POA: Insufficient documentation

## 2024-09-09 NOTE — Progress Notes (Signed)
 [facility]  Date of Visit: 09/09/2024  History of Present Illness: Kathryn Norman is a 44 y.o. female scheduled today for a post-operative visit.   Surgery: s/p stage I interstim on 09/01/24  Postoperative course was uncomplicated.   Reports 3/10 pain today after Ibuprofen , continues gabapentin at night.  Reduced voids to 2-3x/day, 1x/night and UUI 1x/day Difficulty with dressing, reapplied band aids over tegaderm due to movement.   Denies fever, bleeding, or abnormal discharge Unable to take Tylenol  due to red dye allergy  Medications: She has a current medication list which includes the following prescription(s): albuterol , albuterol , aripiprazole, atomoxetine, qvar  redihaler, diclofenac , famotidine, gabapentin, ibuprofen , ibuprofen , meloxicam, methocarbamol , mirtazapine, montelukast , omeprazole, ondansetron , oxycodone , sumatriptan , vitamin d (ergocalciferol), wixela inhub, and [DISCONTINUED] cetirizine .   Allergies: Patient is allergic to amoxicillin , latex, methocarbamol , acetaminophen , and red dye #40 (allura red).   Physical Exam: BP 123/85   Pulse 91   LMP  (LMP Unknown)    Physical Exam Constitutional:      General: She is not in acute distress.    Appearance: Normal appearance.  Cardiovascular:     Rate and Rhythm: Normal rate.  Pulmonary:     Effort: Pulmonary effort is normal. No respiratory distress.  Musculoskeletal:       Legs:     Comments: No erythema, discharge, or wound separation. Sutures intact.  Reapplied gauze and Tegaderm over incisions.   Neurological:     Mental Status: She is alert.  Vitals reviewed. Exam conducted with a chaperone present.     ---------------------------------------------------------  Assessment and Plan:  1. Dressing change     Dressing change    Can resume regular activity avoiding strenuous exercise - no showers or baths until after Stage II interstim placement - Discussed avoidance of heavy lifting and  straining  All questions answered.   No follow-ups on file.

## 2024-09-14 ENCOUNTER — Encounter (HOSPITAL_COMMUNITY): Payer: Self-pay | Admitting: Obstetrics

## 2024-09-14 ENCOUNTER — Other Ambulatory Visit: Payer: Self-pay

## 2024-09-14 NOTE — Progress Notes (Signed)
 Spoke w/  Holli Pouch via phone for pre-op interview  Lab needs dos--urine HCG       Lab results COVID test -----patient states asymptomatic no test needed Arrive at  05:30 NPO after MN NO Solid Food. No Clear liquids after MN  Pre-Surgery Ensure or G2:N/A  Med rec completed Medications to take morning of surgery Inhalers and gabapentin Diabetic medication N/A  GLP1 agonist last dose:N/A GLP1 instructions:N/A  Patient instructed no nail polish to be worn day of surgery Patient instructed to bring photo id and insurance card day of surgery Patient aware to have Driver (ride ) / caregiver    for 24 hours after surgery - Daughter  Patient Special Instructions ----- Pre-Op special Instructions -----  Patient verbalized understanding of instructions that were given at this phone interview. Patient denies chest pain, sob, fever, cough at the interview.

## 2024-09-16 MED ORDER — VANCOMYCIN HCL 1000 MG IV SOLR
INTRAVENOUS | Status: AC
  Filled 2024-09-16: qty 20

## 2024-09-17 ENCOUNTER — Ambulatory Visit (HOSPITAL_COMMUNITY): Admitting: Anesthesiology

## 2024-09-17 ENCOUNTER — Other Ambulatory Visit: Payer: Self-pay | Admitting: Obstetrics

## 2024-09-17 ENCOUNTER — Encounter (HOSPITAL_COMMUNITY)
Admission: RE | Disposition: A | Discharge status: Home / Self Care | Payer: Self-pay | Source: Home / Self Care | Attending: Obstetrics

## 2024-09-17 ENCOUNTER — Ambulatory Visit (HOSPITAL_COMMUNITY)
Admission: RE | Admit: 2024-09-17 | Discharge: 2024-09-17 | Disposition: A | Discharge status: Home / Self Care | Attending: Obstetrics | Admitting: Obstetrics

## 2024-09-17 ENCOUNTER — Ambulatory Visit (HOSPITAL_COMMUNITY)

## 2024-09-17 ENCOUNTER — Encounter (HOSPITAL_COMMUNITY): Payer: Self-pay | Admitting: Obstetrics

## 2024-09-17 DIAGNOSIS — G8918 Other acute postprocedural pain: Secondary | ICD-10-CM

## 2024-09-17 DIAGNOSIS — K219 Gastro-esophageal reflux disease without esophagitis: Secondary | ICD-10-CM | POA: Insufficient documentation

## 2024-09-17 DIAGNOSIS — N3281 Overactive bladder: Secondary | ICD-10-CM | POA: Insufficient documentation

## 2024-09-17 DIAGNOSIS — Z87891 Personal history of nicotine dependence: Secondary | ICD-10-CM | POA: Insufficient documentation

## 2024-09-17 DIAGNOSIS — R351 Nocturia: Secondary | ICD-10-CM

## 2024-09-17 DIAGNOSIS — J45909 Unspecified asthma, uncomplicated: Secondary | ICD-10-CM | POA: Diagnosis not present

## 2024-09-17 DIAGNOSIS — Z01818 Encounter for other preprocedural examination: Secondary | ICD-10-CM

## 2024-09-17 LAB — NO BLOOD PRODUCTS

## 2024-09-17 LAB — POCT PREGNANCY, URINE: Preg Test, Ur: NEGATIVE

## 2024-09-17 MED ORDER — GLYCOPYRROLATE 0.2 MG/ML IJ SOLN
INTRAMUSCULAR | Status: DC | PRN
  Administered 2024-09-17: .2 mg via INTRAVENOUS

## 2024-09-17 MED ORDER — CEFAZOLIN SODIUM-DEXTROSE 2-4 GM/100ML-% IV SOLN
INTRAVENOUS | Status: AC
  Filled 2024-09-17: qty 100

## 2024-09-17 MED ORDER — KETOROLAC TROMETHAMINE 30 MG/ML IJ SOLN
30.0000 mg | INTRAMUSCULAR | Status: AC
  Administered 2024-09-17: 30 mg via INTRAVENOUS

## 2024-09-17 MED ORDER — SCOPOLAMINE 1 MG/3DAYS TD PT72
MEDICATED_PATCH | TRANSDERMAL | Status: AC
  Administered 2024-09-17: 1 mg via TRANSDERMAL
  Filled 2024-09-17: qty 1

## 2024-09-17 MED ORDER — PROPOFOL 500 MG/50ML IV EMUL
INTRAVENOUS | Status: DC | PRN
  Administered 2024-09-17: 100 ug/kg/min via INTRAVENOUS

## 2024-09-17 MED ORDER — ACETAMINOPHEN 500 MG PO TABS
ORAL_TABLET | ORAL | Status: AC
  Filled 2024-09-17: qty 2

## 2024-09-17 MED ORDER — CHLORHEXIDINE GLUCONATE 0.12 % MT SOLN
OROMUCOSAL | Status: AC
  Administered 2024-09-17: 15 mL via OROMUCOSAL
  Filled 2024-09-17: qty 15

## 2024-09-17 MED ORDER — DEXAMETHASONE SOD PHOSPHATE PF 10 MG/ML IJ SOLN
INTRAMUSCULAR | Status: AC
  Filled 2024-09-17: qty 1

## 2024-09-17 MED ORDER — GENTAMICIN SULFATE 40 MG/ML IJ SOLN
5.0000 mg/kg | INTRAVENOUS | Status: AC
  Administered 2024-09-17: 267.6 mg via INTRAVENOUS
  Filled 2024-09-17: qty 6.75

## 2024-09-17 MED ORDER — ONDANSETRON HCL 4 MG/2ML IJ SOLN
INTRAMUSCULAR | Status: DC | PRN
  Administered 2024-09-17: 4 mg via INTRAVENOUS

## 2024-09-17 MED ORDER — MIDAZOLAM HCL 2 MG/2ML IJ SOLN
INTRAMUSCULAR | Status: AC
  Filled 2024-09-17: qty 2

## 2024-09-17 MED ORDER — PROPOFOL 10 MG/ML IV BOLUS
INTRAVENOUS | Status: AC
  Filled 2024-09-17: qty 20

## 2024-09-17 MED ORDER — CEFAZOLIN SODIUM-DEXTROSE 2-4 GM/100ML-% IV SOLN
2.0000 g | INTRAVENOUS | Status: AC
  Administered 2024-09-17: 2 g via INTRAVENOUS

## 2024-09-17 MED ORDER — FENTANYL CITRATE (PF) 100 MCG/2ML IJ SOLN: 25.0000 ug | INTRAMUSCULAR | Status: DC | PRN

## 2024-09-17 MED ORDER — SCOPOLAMINE 1 MG/3DAYS TD PT72: 1.0000 | MEDICATED_PATCH | TRANSDERMAL | Status: DC

## 2024-09-17 MED ORDER — LIDOCAINE 2% (20 MG/ML) 5 ML SYRINGE
INTRAMUSCULAR | Status: DC | PRN
  Administered 2024-09-17: 60 mg via INTRAVENOUS

## 2024-09-17 MED ORDER — FENTANYL CITRATE (PF) 100 MCG/2ML IJ SOLN
INTRAMUSCULAR | Status: AC
  Filled 2024-09-17: qty 2

## 2024-09-17 MED ORDER — ONDANSETRON HCL 4 MG/2ML IJ SOLN
INTRAMUSCULAR | Status: AC
  Filled 2024-09-17: qty 2

## 2024-09-17 MED ORDER — ORAL CARE MOUTH RINSE: 15.0000 mL | Freq: Once | OROMUCOSAL | Status: AC

## 2024-09-17 MED ORDER — FENTANYL CITRATE (PF) 100 MCG/2ML IJ SOLN
INTRAMUSCULAR | Status: DC | PRN
  Administered 2024-09-17 (×2): 50 ug via INTRAVENOUS

## 2024-09-17 MED ORDER — LIDOCAINE 2% (20 MG/ML) 5 ML SYRINGE
INTRAMUSCULAR | Status: AC
  Filled 2024-09-17: qty 5

## 2024-09-17 MED ORDER — WATER FOR IRRIGATION, STERILE IR SOLN
Status: DC | PRN
  Administered 2024-09-17: 1000 mL

## 2024-09-17 MED ORDER — ACETAMINOPHEN 500 MG PO TABS: 1000.0000 mg | ORAL_TABLET | Freq: Once | ORAL | Status: DC

## 2024-09-17 MED ORDER — LACTATED RINGERS IV SOLN: INTRAVENOUS | Status: DC

## 2024-09-17 MED ORDER — CHLORHEXIDINE GLUCONATE 0.12 % MT SOLN: 15.0000 mL | Freq: Once | OROMUCOSAL | Status: AC

## 2024-09-17 MED ORDER — PROPOFOL 10 MG/ML IV BOLUS
INTRAVENOUS | Status: DC | PRN
  Administered 2024-09-17 (×2): 50 mg via INTRAVENOUS

## 2024-09-17 MED ORDER — PHENYLEPHRINE 80 MCG/ML (10ML) SYRINGE FOR IV PUSH (FOR BLOOD PRESSURE SUPPORT)
PREFILLED_SYRINGE | INTRAVENOUS | Status: DC | PRN
  Administered 2024-09-17: 80 ug via INTRAVENOUS

## 2024-09-17 MED ORDER — BUPIVACAINE HCL 0.25 % IJ SOLN
INTRAMUSCULAR | Status: DC | PRN
  Administered 2024-09-17: 15 mL

## 2024-09-17 MED ORDER — OXYCODONE HCL 5 MG PO TABS: 5.0000 mg | ORAL_TABLET | ORAL | 0 refills | Status: AC | PRN

## 2024-09-17 MED ORDER — DEXAMETHASONE SOD PHOSPHATE PF 10 MG/ML IJ SOLN
INTRAMUSCULAR | Status: DC | PRN
  Administered 2024-09-17: 10 mg via INTRAVENOUS

## 2024-09-17 MED ORDER — DEXMEDETOMIDINE HCL IN NACL 80 MCG/20ML IV SOLN
INTRAVENOUS | Status: DC | PRN
  Administered 2024-09-17: 8 ug via INTRAVENOUS

## 2024-09-17 MED ORDER — DROPERIDOL 2.5 MG/ML IJ SOLN: 0.6250 mg | Freq: Once | INTRAMUSCULAR | Status: DC | PRN

## 2024-09-17 MED ORDER — MIDAZOLAM HCL (PF) 2 MG/2ML IJ SOLN
INTRAMUSCULAR | Status: DC | PRN
  Administered 2024-09-17: 2 mg via INTRAVENOUS

## 2024-09-17 NOTE — Op Note (Signed)
 Operative Note  Preoperative Diagnosis: Refractory overactive bladder   Postoperative Diagnosis: Refractory overactive bladder   Procedures performed:  Stage II insertion of sacral neuromodulator  Implants:  Implant Name Type Inv. Item Serial No. Manufacturer Lot No. LRB No. Used Action  POUCH TYRX ANTIBAC NEURO MED - [id number] Mesh General POUCH TYRX ANTIBAC NEURO MED  [employer] NEUROMOD PAIN MGMT  Right 1 Implanted  STIMULATOR INTERSTIM 2X1.7X.3 - [id number] Miscellaneous STIMULATOR INTERSTIM 2X1.7X.3  MEDTRONIC NEUROMOD PAIN MGMT  Right 1 Implanted    Attending Surgeon: Lianne Leila Gillis, MD  Assistant Surgeon: n/a  Assistant: n/a  Anesthesia: MAC  Findings: No erythema, discharge at incisions.   Specimens: * No specimens in log *  Estimated blood loss: 5 mL  IV fluids: 1100 mL  Urine output: 0 mL  Complications: none  Procedure in Detail: The patient was taken to the operating room where anesthesia was induced and found to be adequate. She was placed in the prone position with pressure points padded and prepped and draped in the usual sterile fashion.  Local anesthetic was injected over the site of her prior right sided buttock incision. Vicryl sutures were removed then the incision was opened with a #15 blade scalpel down to the subcutaneous layer.  The lead wires and boot were identified. The sutures holding the boot in place were cut and the boot was removed.  The distal end of the extension wire was cut and this wire was removed through the superior skin incision.  The lead wire was then plugged into the generator and also screwed in x 1.  The pocket was copiously irrigated and noted to be hemostatic. The generator was placed in the pocket with a Tyrx pouch. It was noted to have adequate room but also stayed in place. The incision was inspected and noted to be hemostatic.  The subcutaneous layer was closed with running 2-0 Vicryl. The skin was closed with a subcuticular stitch  of 4-0 monocryl with Dermabond placed. The incision site of the extension wire was closed with an interrupted monocryl suture.  Sponge, lap and needle counts were correct x 2.  The patient tolerated the procedure well.  Programming was performed in the recovery room.

## 2024-09-17 NOTE — Progress Notes (Signed)
 Rx oxycodone  for postoperative pain.

## 2024-09-17 NOTE — Anesthesia Postprocedure Evaluation (Signed)
"   Anesthesia Post Note  Patient: Kathryn Norman  Procedure(s) Performed: INSERTION, NEUROSTIMULATOR, SACRAL STAGE II (Right: Back)     Patient location during evaluation: PACU Anesthesia Type: MAC Level of consciousness: awake and alert Pain management: pain level controlled Vital Signs Assessment: post-procedure vital signs reviewed and stable Respiratory status: spontaneous breathing, nonlabored ventilation, respiratory function stable and patient connected to nasal cannula oxygen  Cardiovascular status: stable and blood pressure returned to baseline Postop Assessment: no apparent nausea or vomiting Anesthetic complications: no   No notable events documented.  Last Vitals:  Vitals:   09/17/24 0913 09/17/24 0915  BP: 101/77 115/83  Pulse: 84 70  Resp: (!) 42 (!) 23  Temp:    SpO2: 98% 91%    Last Pain:  Vitals:   09/17/24 0900  TempSrc:   PainSc: 0-No pain                 Cordella P Moncerrath Berhe      "

## 2024-09-17 NOTE — Anesthesia Preprocedure Evaluation (Signed)
 "                                  Anesthesia Evaluation  Patient identified by MRN, date of birth, ID band Patient awake    Reviewed: Allergy & Precautions, NPO status , Patient's Chart, lab work & pertinent test results  Airway Mallampati: II  TM Distance: >3 FB Neck ROM: Full    Dental no notable dental hx.    Pulmonary asthma , former smoker   Pulmonary exam normal        Cardiovascular negative cardio ROS  Rhythm:Regular Rate:Normal     Neuro/Psych  Headaches  Anxiety Depression Bipolar Disorder      GI/Hepatic Neg liver ROS,GERD  Medicated,,  Endo/Other  negative endocrine ROS    Renal/GU negative Renal ROS Bladder dysfunction      Musculoskeletal negative musculoskeletal ROS (+)    Abdominal Normal abdominal exam  (+)   Peds  Hematology  (+) Blood dyscrasia, anemia Lab Results      Component                Value               Date                      WBC                      5.8                 07/25/2022                HGB                      13.8                07/25/2022                HCT                      40.9                07/25/2022                MCV                      91                  07/25/2022                PLT                      240                 07/25/2022              Anesthesia Other Findings   Reproductive/Obstetrics                              Anesthesia Physical Anesthesia Plan  ASA: 2  Anesthesia Plan: MAC   Post-op Pain Management: Tylenol  PO (pre-op)*   Induction: Intravenous  PONV Risk Score and Plan: 2 and Propofol  infusion, Treatment may vary due to age or medical condition, Ondansetron , Dexamethasone  and Midazolam   Airway Management Planned: Simple Face Mask  and Nasal Cannula  Additional Equipment: None  Intra-op Plan:   Post-operative Plan:   Informed Consent: I have reviewed the patients History and Physical, chart, labs and discussed the procedure  including the risks, benefits and alternatives for the proposed anesthesia with the patient or authorized representative who has indicated his/her understanding and acceptance.     Dental advisory given  Plan Discussed with: CRNA  Anesthesia Plan Comments:         Anesthesia Quick Evaluation  "

## 2024-09-17 NOTE — H&P (Signed)
 [facility] Pre-Operative H&P   Subjective Chief Complaint: Kathryn Norman presents for a preoperative encounter.    History of Present Illness: Kathryn Norman is a 44 y.o. female who presents for preoperative visit.  She is scheduled to undergo Stage II sacral neuromodulation today.  Her symptoms include refractory overactive bladder.   Reports > 50% improvement.  Denies any fever, chills, discharge, or bleeding.   Baseline symptoms: Limited bathroom breaks at work, leaks 3x during work Initial reduction of UUI 1-5x/week on 2 medications, now increased urinary symptoms Completed bladder diary Reports SUI 2x/week, works at [employer] with limited access to [employer] use: 4-5 pads per day.   Prior use of Gemtesa  with relief of urgency, Day time voids 20 down 10x/day.  Nocturia: 6 times/night down to 3x/night. However discontinued since it was cost prohibitive Sleeps around 11-12pm Reports snoring, denies history of assessment or diagnosis sleep apnea Uses 2-3 pads at night with blankets for leakage. Reports left lower leg swelling with bruising Drinks: 8 cups water/day  Triggered by soda and coffee BM around 3x/week, [place] IV-V attributed to [facility]  Reports straining despite squatting position. Tried linzess, miralax 2 capful/day without relief Urinary leakage at work and at night.   Failed oxybutynin and detrol  (constipation), Myrbetriq  50mg  and Trospium  60mg  ER (no relief) History of lumbar radiculopathy and underwent injections and ongoing PT for back pain       Past Medical History:  Diagnosis Date   ADHD (attention deficit hyperactivity disorder)     Anemia     Anxiety and depression 06/19/2012   Asthma     Chlamydia infection 10/18/2014    Treated   Depression     Hemorrhagic ovarian cyst 09/07/2012   Migraines                 Past Surgical History:  Procedure Laterality Date   CHOLECYSTECTOMY       DILATION AND CURETTAGE OF UTERUS        x  2          is allergic to latex, methocarbamol , acetaminophen , and red dye #40 (allura red).         Family History  Problem Relation Age of Onset   Migraines Mother     Alopecia Mother     Sickle cell trait Mother     Lung cancer Father          died of lung cancer   Sickle cell anemia Sister     Seizures Brother     Uterine cancer Neg Hx     Bladder Cancer Neg Hx     Renal cancer Neg Hx            [Social History]  [Social History]      Tobacco Use   Smoking status: Former      Current packs/day: 0.10      Average packs/day: 0.1 packs/day for 12.9 years (1.3 ttl pk-yrs)      Types: Cigarettes      Start date: 10/21/2011   Smokeless tobacco: Never  Vaping Use   Vaping status: Never Used  Substance Use Topics   Alcohol  use: No      Alcohol /week: 0.0 standard drinks of alcohol    Drug use: No       Review of Systems was negative for a full 10 system review except as noted in the History of Present Illness.   [Current Medications]     [Current Medications] No  current facility-administered medications for this encounter.   Current Outpatient Medications:    ARIPiprazole (ABILIFY) 10 MG tablet, Take 10 mg by mouth daily., Disp: , Rfl:    beclomethasone (QVAR  REDIHALER) 80 MCG/ACT inhaler, Inhale 1 puff into the lungs 2 (two) times daily., Disp: 10.6 g, Rfl: 1   famotidine (PEPCID) 20 MG tablet, Take 20 mg by mouth at bedtime as needed., Disp: , Rfl:    gabapentin (NEURONTIN) 100 MG capsule, Take by mouth., Disp: , Rfl:    meloxicam (MOBIC) 7.5 MG tablet, 1 tablet Orally Once a day, Disp: , Rfl:    montelukast  (SINGULAIR ) 10 MG tablet, Take 1 tablet (10 mg total) by mouth at bedtime., Disp: 30 tablet, Rfl: 2   Vibegron  (GEMTESA ) 75 MG TABS, Take 1 tablet (75 mg total) by mouth daily., Disp: 30 tablet, Rfl: 5   Vitamin D, Ergocalciferol, 50000 units CAPS, Take one capsule by mouth weekly and the switch to 1000-2000 unit maintenance dosing Oral, Disp: , Rfl:     WIXELA INHUB 500-50 MCG/ACT AEPB, INHALE 1 PUFF INTO THE LUNGS TWICE A DAY FOR 30 DAYS Inhalation for 30 Days, Disp: , Rfl:    albuterol  (PROAIR  HFA) 108 (90 Base) MCG/ACT inhaler, Inhale 1-2 puffs into the lungs every 6 (six) hours as needed for wheezing or shortness of breath., Disp: 8 g, Rfl: 0   albuterol  (PROVENTIL ) (2.5 MG/3ML) 0.083% nebulizer solution, Take 3 mLs (2.5 mg total) by nebulization every 6 (six) hours as needed for wheezing or shortness of breath., Disp: 75 mL, Rfl: 0   atomoxetine (STRATTERA) 40 MG capsule, Take 40 mg by mouth daily. (Patient not taking: Reported on 08/24/2024), Disp: , Rfl:    diclofenac  (VOLTAREN ) 75 MG EC tablet, Take 75 mg by mouth 2 (two) times daily as needed. (Patient not taking: Reported on 08/24/2024), Disp: , Rfl:    ibuprofen  (ADVIL ) 800 MG tablet, Take 1 tablet (800 mg total) by mouth every 8 (eight) hours as needed., Disp: 30 tablet, Rfl: 0   methocarbamol  (ROBAXIN ) 500 MG tablet, Take 500 mg by mouth every 8 (eight) hours as needed. (Patient not taking: Reported on 08/24/2024), Disp: , Rfl:    mirtazapine (REMERON) 15 MG tablet, Take 15 mg by mouth at bedtime., Disp: , Rfl:    omeprazole (PRILOSEC) 40 MG capsule, Take one tablet daily to control reflux symptoms, if not controlled can increase to twice daily. Oral, Disp: , Rfl:    ondansetron  (ZOFRAN -ODT) 4 MG disintegrating tablet, Take 1 tablet (4 mg total) by mouth every 8 (eight) hours as needed for nausea or vomiting., Disp: 20 tablet, Rfl: 0   SUMAtriptan  (IMITREX ) 100 MG tablet, Take 1 tablet (100 mg total) by mouth once as needed for up to 1 dose for migraine. May repeat in 2 hours if headache persists or recurs., Disp: 9 tablet, Rfl: 2 Objective There were no vitals filed for this visit.   Gen: NAD CV: S1 S2 RRR Lungs: Clear to auscultation bilaterally Abd: soft, nontender Pelvic exam deferred  Incision with dressing C/D/I   Lab Results  Component Value Date   WBC 5.8 07/25/2022    HGB 13.8 07/25/2022   HCT 40.9 07/25/2022   MCV 91 07/25/2022   PLT 240 07/25/2022   Lab Results  Component Value Date   CREATININE 0.92 07/25/2022      Assessment/ Plan   Assessment: The patient is a 44 y.o. year old scheduled to undergo Stage I sacral neuromodulation with fluoroscopy. Written consent  was obtained for these procedures.   Plan: General Surgical Consent: The patient has previously been counseled on alternative treatments, and the decision by the patient and provider was to proceed with the procedure listed above.   We discussed the role of sacral neuromodulation and how it works. It requires a test phase, and documentation of bladder function via diary. After a successful test period, a permanent wire and generator are placed in the OR. The battery lasts 5 years on average and would need to be replaced surgically.  The goal of this therapy is at least a 50% improvement in symptoms. It is NOT realistic to expect a 100% cure.  We reviewed the fact that about 30% of patients fail the test phase and are not candidates for permanent generator placement.  We discussed the risk of infection and that the patient would have to switch to MRI compatible mode during imaging once the device is placed. There are two companies that provide this therapy: [employer] and [employer] that both offer rechargeable or non-rechargeable options. For all procedures, we discussed risks of bleeding, infection, damage to surrounding organs including bowel, bladder, blood vessels, ureters and nerves, need for further surgery, risk of postoperative urinary incontinence or retention with need to catheterize, recurrent prolapse, numbness and weakness at any body site, buttock pain, and the rarer risks of blood clot, heart attack, pneumonia, death.     For all procedures, there are risks of bleeding, infection, damage to surrounding organs including but not limited to bowel, bladder, blood vessels, ureters and  nerves, and need for further surgery if an injury were to occur. These risks are all low with minimally invasive surgery.    There are risks of numbness and weakness at any body site or buttock/rectal pain.  It is possible that baseline pain can be worsened by surgery, either with or without mesh. If surgery is vaginal, there is also a low risk of possible conversion to laparoscopy or open abdominal incision where indicated. Very rare risks include blood transfusion, blood clot, heart attack, pneumonia, or death.    There is also a risk of worsening of overactive bladder.    We discussed consent for blood products. Risks for blood transfusion include allergic reactions, other reactions that can affect different body organs and managed accordingly, transmission of infectious diseases such as HIV or Hepatitis. However, the blood is screened. Patient consents for blood products.   Pre-operative instructions:  She was instructed to not take Aspirin/NSAIDs x 7days prior to surgery.  Antibiotic prophylaxis was ordered as indicated.   Post-operative instructions:  She was provided with specific post-operative instructions, including precautions and signs/symptoms for which we would recommend contacting us , in addition to daytime and after-hours contact phone numbers. This was provided on a handout.    Post-operative medications: Prescriptions for motrin , miralax, and oxycodone  were sent to her pharmacy. Discussed using ibuprofen  and tylenol  on a schedule to limit use of narcotics.    Laboratory testing:  We will check labs: per anesthesia. Day of surgery UPT negative   Preoperative clearance:  She does not require surgical clearance.     Post-operative follow-up:  A post-operative appointment will be made for 6 weeks from the date of surgery. If she needs a post-operative nurse visit for a voiding trial, that will be set up after she leaves the hospital.     Patient will call the clinic or use MyChart  should anything change or any new issues arise.  Lianne ONEIDA Gillis, MD

## 2024-09-17 NOTE — Discharge Instructions (Addendum)
 POST OPERATIVE INSTRUCTIONS  General Instructions Recovery (not bed rest) will last approximately 1 weeks Walking is encouraged, but refrain from strenuous exercise/ housework/ heavy lifting. No lifting >10lbs  Bathing:  Do not submerge in water (NO swimming, bath, hot tub, etc) until after your postop visit. You can shower starting the day after surgery.  No driving until you are not taking narcotic pain medicine and until your pain is well enough controlled that you can slam on the breaks or make sudden movements if needed.   Taking your medications Please take your acetaminophen  and ibuprofen  on a schedule for the first 48 hours. Take 600mg  ibuprofen , then take 500mg  acetaminophen  3 hours later, then continue to alternate ibuprofen  and acetaminophen . That way you are taking each type of medication every 6 hours. Take the prescribed narcotic (oxycodone , tramadol , etc) as needed, with a maximum being every 4 hours.  Take a stool softener daily to keep your stools soft and preventing you from straining. If you have diarrhea, you decrease your stool softener. This is explained more below. We have prescribed you Miralax.  Reasons to Call the Nurse (see last page for phone numbers) Heavy Bleeding (changing your pad every 1-2 hours) Persistent nausea/vomiting Fever (100.4 degrees or more) Incision problems (pus or other fluid coming out, redness, warmth, increased pain)  Things to Expect After Surgery Mild to Moderate pain is normal during the first day or two after surgery. If prescribed, take Ibuprofen  or Tylenol  first and use the stronger medicine for break-through pain. You can overlap these medicines because they work differently.   Constipation   To Prevent Constipation:  Eat a well-balanced diet including protein, grains, fresh fruit and vegetables.  Drink plenty of fluids. Walk regularly.  Depending on specific instructions from your physician: take Miralax daily and additionally you  can add a stool softener (colace/ docusate) and fiber supplement. Continue as long as you're on pain medications.   To Treat Constipation:  If you do not have a bowel movement in 2 days after surgery, you can take 2 Tbs of Milk of Magnesia 1-2 times a day until you have a bowel movement. If diarrhea occurs, decrease the amount or stop the laxative. If no results with Milk of Magnesia, you can drink a bottle of magnesium citrate which you can purchase over the counter.  Fatigue:  This is a normal response to surgery and will improve with time.  Plan frequent rest periods throughout the day.  Gas Pain:  This is very common but can also be very painful! Drink warm liquids such as herbal teas, bouillon or soup. Walking will help you pass more gas.  Mylicon or Gas-X can be taken over the counter.  Leaking Urine:  Varying amounts of leakage may occur after surgery.  This should improve with time. Your bladder needs at least 3 months to recover from surgery. If you leak after surgery, be sure to mention this to your doctor at your post-op visit. If you were taking medications for overactive bladder prior to surgery, be sure to restart the medications immediately after surgery.  Incisions: If you have incisions on your abdomen, the skin glue will dissolve on its own over time. It is ok to gently rinse with soap and water over these incisions but do not scrub.  Return to Work  As work demands and recovery times vary widely, it is hard to predict when you will want to return to work. If you have a desk job with no  strenuous physical activity, and if you would like to return sooner than generally recommended, discuss this with your provider or call our office.   Post op concerns  For non-emergent issues, please call the Urogynecology Nurse. Please leave a message and someone will contact you within one business day.  You can also send a message through MyChart.   AFTER HOURS (After 5:00 PM and on weekends):   For urgent matters that cannot wait until the next business day. Call our office [phone] and connect to the doctor on call.  Please reserve this for important issues.   **FOR ANY TRUE EMERGENCY ISSUES CALL 911 OR GO TO THE NEAREST EMERGENCY ROOM.** Please inform our office or the doctor on call of any emergency.     APPOINTMENTS: Call [phone]

## 2024-09-17 NOTE — Transfer of Care (Signed)
 Immediate Anesthesia Transfer of Care Note  Patient: Kathryn Norman  Procedure(s) Performed: INSERTION, NEUROSTIMULATOR, SACRAL STAGE II (Right: Back)  Patient Location: PACU  Anesthesia Type:MAC  Level of Consciousness: drowsy  Airway & Oxygen  Therapy: Patient Spontanous Breathing  Post-op Assessment: Report given to RN and Post -op Vital signs reviewed and stable  Post vital signs: Reviewed and stable  Last Vitals:  Vitals Value Taken Time  BP 106/70 09/17/24 08:38  Temp    Pulse 67 09/17/24 08:40  Resp 14 09/17/24 08:40  SpO2 98 % 09/17/24 08:40  Vitals shown include unfiled device data.  Last Pain:  Vitals:   09/17/24 0614  TempSrc: Oral  PainSc: 0-No pain      Patients Stated Pain Goal: 3 (09/17/24 9385)  Complications: No notable events documented.

## 2024-09-29 ENCOUNTER — Other Ambulatory Visit: Payer: Self-pay | Admitting: Obstetrics and Gynecology

## 2024-09-29 DIAGNOSIS — G8918 Other acute postprocedural pain: Secondary | ICD-10-CM

## 2024-10-13 ENCOUNTER — Encounter: Admitting: Obstetrics

## 2024-10-29 ENCOUNTER — Ambulatory Visit: Admitting: Neurology
